# Patient Record
Sex: Female | Born: 1980 | Race: Black or African American | Hispanic: No | Marital: Single | Smoking: Former smoker
Health system: Southern US, Community
[De-identification: ages and names within clinical notes are randomized; demographics above are authoritative.]

## PROBLEM LIST (undated history)

## (undated) DIAGNOSIS — A159 Respiratory tuberculosis unspecified: Secondary | ICD-10-CM

## (undated) DIAGNOSIS — F419 Anxiety disorder, unspecified: Secondary | ICD-10-CM

## (undated) DIAGNOSIS — K219 Gastro-esophageal reflux disease without esophagitis: Secondary | ICD-10-CM

## (undated) DIAGNOSIS — D369 Benign neoplasm, unspecified site: Secondary | ICD-10-CM

## (undated) DIAGNOSIS — R7303 Prediabetes: Secondary | ICD-10-CM

## (undated) DIAGNOSIS — D219 Benign neoplasm of connective and other soft tissue, unspecified: Secondary | ICD-10-CM

## (undated) DIAGNOSIS — R87619 Unspecified abnormal cytological findings in specimens from cervix uteri: Secondary | ICD-10-CM

## (undated) DIAGNOSIS — G43829 Menstrual migraine, not intractable, without status migrainosus: Secondary | ICD-10-CM

## (undated) DIAGNOSIS — E785 Hyperlipidemia, unspecified: Secondary | ICD-10-CM

## (undated) DIAGNOSIS — A64 Unspecified sexually transmitted disease: Secondary | ICD-10-CM

## (undated) DIAGNOSIS — J189 Pneumonia, unspecified organism: Secondary | ICD-10-CM

## (undated) HISTORY — DX: Respiratory tuberculosis unspecified: A15.9

## (undated) HISTORY — PX: INDUCED ABORTION: SHX677

## (undated) HISTORY — DX: Unspecified sexually transmitted disease: A64

## (undated) HISTORY — PX: COLPOSCOPY: SHX161

## (undated) HISTORY — DX: Benign neoplasm of connective and other soft tissue, unspecified: D21.9

## (undated) HISTORY — DX: Anxiety disorder, unspecified: F41.9

## (undated) HISTORY — DX: Gastro-esophageal reflux disease without esophagitis: K21.9

## (undated) HISTORY — DX: Menstrual migraine, not intractable, without status migrainosus: G43.829

## (undated) HISTORY — DX: Pneumonia, unspecified organism: J18.9

## (undated) HISTORY — DX: Hyperlipidemia, unspecified: E78.5

## (undated) HISTORY — DX: Unspecified abnormal cytological findings in specimens from cervix uteri: R87.619

## (undated) HISTORY — DX: Prediabetes: R73.03

## (undated) HISTORY — PX: WISDOM TOOTH EXTRACTION: SHX21

## (undated) HISTORY — DX: Benign neoplasm, unspecified site: D36.9

---

## 2005-08-31 ENCOUNTER — Emergency Department (HOSPITAL_COMMUNITY): Admission: EM | Admit: 2005-08-31 | Discharge: 2005-08-31 | Payer: Self-pay | Admitting: Emergency Medicine

## 2006-04-17 ENCOUNTER — Emergency Department (HOSPITAL_COMMUNITY): Admission: EM | Admit: 2006-04-17 | Discharge: 2006-04-17 | Payer: Self-pay | Admitting: Emergency Medicine

## 2007-11-22 ENCOUNTER — Emergency Department (HOSPITAL_COMMUNITY): Admission: EM | Admit: 2007-11-22 | Discharge: 2007-11-23 | Payer: Self-pay | Admitting: Emergency Medicine

## 2008-01-23 ENCOUNTER — Inpatient Hospital Stay (HOSPITAL_COMMUNITY): Admission: AD | Admit: 2008-01-23 | Discharge: 2008-01-23 | Payer: Self-pay | Admitting: Obstetrics & Gynecology

## 2008-01-23 ENCOUNTER — Ambulatory Visit: Payer: Self-pay | Admitting: Obstetrics and Gynecology

## 2008-03-01 ENCOUNTER — Ambulatory Visit (HOSPITAL_COMMUNITY): Admission: RE | Admit: 2008-03-01 | Discharge: 2008-03-01 | Payer: Self-pay | Admitting: Family Medicine

## 2009-05-13 ENCOUNTER — Emergency Department (HOSPITAL_COMMUNITY): Admission: EM | Admit: 2009-05-13 | Discharge: 2009-05-14 | Payer: Self-pay | Admitting: Emergency Medicine

## 2009-11-13 ENCOUNTER — Emergency Department (HOSPITAL_COMMUNITY): Admission: EM | Admit: 2009-11-13 | Discharge: 2009-11-13 | Payer: Self-pay | Admitting: Emergency Medicine

## 2010-09-03 ENCOUNTER — Emergency Department (HOSPITAL_COMMUNITY)
Admission: EM | Admit: 2010-09-03 | Discharge: 2010-09-03 | Payer: Self-pay | Source: Home / Self Care | Admitting: Emergency Medicine

## 2010-10-23 ENCOUNTER — Emergency Department (HOSPITAL_COMMUNITY)
Admission: EM | Admit: 2010-10-23 | Discharge: 2010-10-23 | Payer: Self-pay | Source: Home / Self Care | Admitting: Emergency Medicine

## 2010-10-23 LAB — POCT PREGNANCY, URINE: Preg Test, Ur: NEGATIVE

## 2010-12-17 LAB — URINALYSIS, ROUTINE W REFLEX MICROSCOPIC
Bilirubin Urine: NEGATIVE
Glucose, UA: NEGATIVE mg/dL
Hgb urine dipstick: NEGATIVE
Ketones, ur: NEGATIVE mg/dL
Nitrite: NEGATIVE
Protein, ur: NEGATIVE mg/dL
Specific Gravity, Urine: 1.014 (ref 1.005–1.030)
Urobilinogen, UA: 0.2 mg/dL (ref 0.0–1.0)
pH: 7 (ref 5.0–8.0)

## 2010-12-17 LAB — POCT PREGNANCY, URINE: Preg Test, Ur: NEGATIVE

## 2011-05-26 ENCOUNTER — Emergency Department (HOSPITAL_COMMUNITY)
Admission: EM | Admit: 2011-05-26 | Discharge: 2011-05-26 | Disposition: A | Discharge status: Home / Self Care | Payer: Self-pay | Attending: Emergency Medicine | Admitting: Emergency Medicine

## 2011-05-26 DIAGNOSIS — N949 Unspecified condition associated with female genital organs and menstrual cycle: Secondary | ICD-10-CM | POA: Insufficient documentation

## 2011-05-26 DIAGNOSIS — N739 Female pelvic inflammatory disease, unspecified: Secondary | ICD-10-CM | POA: Insufficient documentation

## 2011-05-26 LAB — URINALYSIS, ROUTINE W REFLEX MICROSCOPIC
Bilirubin Urine: NEGATIVE
Glucose, UA: NEGATIVE mg/dL
Hgb urine dipstick: NEGATIVE
Ketones, ur: NEGATIVE mg/dL
Nitrite: NEGATIVE
Protein, ur: NEGATIVE mg/dL
Specific Gravity, Urine: 1 — ABNORMAL LOW (ref 1.005–1.030)
Urobilinogen, UA: 0.2 mg/dL (ref 0.0–1.0)
pH: 7 (ref 5.0–8.0)

## 2011-05-26 LAB — URINE MICROSCOPIC-ADD ON

## 2014-05-11 ENCOUNTER — Encounter (HOSPITAL_COMMUNITY): Payer: Self-pay | Admitting: Emergency Medicine

## 2014-05-11 DIAGNOSIS — Z3202 Encounter for pregnancy test, result negative: Secondary | ICD-10-CM | POA: Insufficient documentation

## 2014-05-11 DIAGNOSIS — J020 Streptococcal pharyngitis: Secondary | ICD-10-CM | POA: Insufficient documentation

## 2014-05-11 DIAGNOSIS — R51 Headache: Secondary | ICD-10-CM | POA: Insufficient documentation

## 2014-05-11 DIAGNOSIS — F17200 Nicotine dependence, unspecified, uncomplicated: Secondary | ICD-10-CM | POA: Insufficient documentation

## 2014-05-11 LAB — URINE MICROSCOPIC-ADD ON

## 2014-05-11 LAB — URINALYSIS, ROUTINE W REFLEX MICROSCOPIC
BILIRUBIN URINE: NEGATIVE
GLUCOSE, UA: NEGATIVE mg/dL
Ketones, ur: 15 mg/dL — AB
Nitrite: NEGATIVE
Protein, ur: NEGATIVE mg/dL
Specific Gravity, Urine: 1.021 (ref 1.005–1.030)
Urobilinogen, UA: 1 mg/dL (ref 0.0–1.0)
pH: 6 (ref 5.0–8.0)

## 2014-05-11 LAB — COMPREHENSIVE METABOLIC PANEL
ALT: 16 U/L (ref 0–35)
AST: 23 U/L (ref 0–37)
Albumin: 4.4 g/dL (ref 3.5–5.2)
Alkaline Phosphatase: 53 U/L (ref 39–117)
Anion gap: 15 (ref 5–15)
BUN: 9 mg/dL (ref 6–23)
CALCIUM: 9.6 mg/dL (ref 8.4–10.5)
CHLORIDE: 95 meq/L — AB (ref 96–112)
CO2: 25 mEq/L (ref 19–32)
CREATININE: 1.15 mg/dL — AB (ref 0.50–1.10)
GFR calc Af Amer: 73 mL/min — ABNORMAL LOW (ref >90)
GFR, EST NON AFRICAN AMERICAN: 63 mL/min — AB (ref >90)
Glucose, Bld: 113 mg/dL — ABNORMAL HIGH (ref 70–99)
Potassium: 3.8 mEq/L (ref 3.7–5.3)
Sodium: 135 mEq/L — ABNORMAL LOW (ref 137–147)
Total Bilirubin: 0.5 mg/dL (ref 0.3–1.2)
Total Protein: 8.4 g/dL — ABNORMAL HIGH (ref 6.0–8.3)

## 2014-05-11 LAB — CBC WITH DIFFERENTIAL/PLATELET
Basophils Absolute: 0 10*3/uL (ref 0.0–0.1)
Basophils Relative: 0 % (ref 0–1)
Eosinophils Absolute: 0 10*3/uL (ref 0.0–0.7)
Eosinophils Relative: 0 % (ref 0–5)
HCT: 42.7 % (ref 36.0–46.0)
Hemoglobin: 14.2 g/dL (ref 12.0–15.0)
Lymphocytes Relative: 11 % — ABNORMAL LOW (ref 12–46)
Lymphs Abs: 1.1 10*3/uL (ref 0.7–4.0)
MCH: 29.6 pg (ref 26.0–34.0)
MCHC: 33.3 g/dL (ref 30.0–36.0)
MCV: 89 fL (ref 78.0–100.0)
Monocytes Absolute: 0.7 10*3/uL (ref 0.1–1.0)
Monocytes Relative: 7 % (ref 3–12)
Neutro Abs: 8.6 10*3/uL — ABNORMAL HIGH (ref 1.7–7.7)
Neutrophils Relative %: 82 % — ABNORMAL HIGH (ref 43–77)
Platelets: 224 10*3/uL (ref 150–400)
RBC: 4.8 MIL/uL (ref 3.87–5.11)
RDW: 12.5 % (ref 11.5–15.5)
WBC: 10.5 10*3/uL (ref 4.0–10.5)

## 2014-05-11 LAB — PREGNANCY, URINE: PREG TEST UR: NEGATIVE

## 2014-05-11 NOTE — ED Notes (Signed)
She took tylenol 2 hours ago

## 2014-05-11 NOTE — ED Notes (Signed)
The pt  Has had a headache since yesterday n v lmp  July 5th

## 2014-05-12 ENCOUNTER — Emergency Department (HOSPITAL_COMMUNITY)
Admission: EM | Admit: 2014-05-12 | Discharge: 2014-05-12 | Discharge status: Against Medical Advice | Payer: Medicaid Other | Attending: Emergency Medicine | Admitting: Emergency Medicine

## 2014-05-12 DIAGNOSIS — J020 Streptococcal pharyngitis: Secondary | ICD-10-CM

## 2014-05-12 LAB — RAPID STREP SCREEN (MED CTR MEBANE ONLY): STREPTOCOCCUS, GROUP A SCREEN (DIRECT): POSITIVE — AB

## 2014-05-12 MED ORDER — IBUPROFEN 800 MG PO TABS
800.0000 mg | ORAL_TABLET | Freq: Once | ORAL | Status: AC
  Administered 2014-05-12: 800 mg via ORAL

## 2014-05-12 MED ORDER — HYDROCODONE-ACETAMINOPHEN 7.5-325 MG/15ML PO SOLN
10.0000 mL | Freq: Once | ORAL | Status: AC
  Administered 2014-05-12: 10 mL via ORAL

## 2014-05-12 MED ORDER — PENICILLIN G BENZATHINE 1200000 UNIT/2ML IM SUSP
1.2000 10*6.[IU] | Freq: Once | INTRAMUSCULAR | Status: AC
  Administered 2014-05-12: 1.2 10*6.[IU] via INTRAMUSCULAR
  Filled 2014-05-12: qty 2

## 2014-05-12 MED ORDER — HYDROCODONE-ACETAMINOPHEN 7.5-325 MG/15ML PO SOLN
ORAL | Status: AC
  Filled 2014-05-12: qty 15

## 2014-05-12 MED ORDER — HYDROCODONE-ACETAMINOPHEN 7.5-325 MG/15ML PO SOLN: 10.0000 mL | Freq: Four times a day (QID) | ORAL | Status: DC | PRN

## 2014-05-12 NOTE — Discharge Instructions (Signed)
You were given a penicillin shot today, which should cure your strep throat.  Alternate tylenol and motrin every 4-6 hours for fever, pain.  Hycet elixir contains tylenol!  Return to the ER for worsening condition or new concerning symptoms.   Salt Water Gargle This solution will help make your mouth and throat feel better. HOME CARE INSTRUCTIONS   Mix 1 teaspoon of salt in 8 ounces of warm water.  Gargle with this solution as much or often as you need or as directed. Swish and gargle gently if you have any sores or wounds in your mouth.  Do not swallow this mixture. Document Released: 06/18/2004 Document Revised: 12/07/2011 Document Reviewed: 11/09/2008 [facility] Patient Information 2015 [place], [place]. This information is not intended to replace advice given to you by your health care provider. Make sure you discuss any questions you have with your health care provider.  Strep Throat Strep throat is an infection of the throat caused by a bacteria named Streptococcus pyogenes. Your health care provider may call the infection streptococcal "tonsillitis" or "pharyngitis" depending on whether there are signs of inflammation in the tonsils or back of the throat. Strep throat is most common in children aged 5-15 years during the cold months of the year, but it can occur in people of any age during any season. This infection is spread from person to person (contagious) through coughing, sneezing, or other close contact. SIGNS AND SYMPTOMS   Fever or chills.  Painful, swollen, red tonsils or throat.  Pain or difficulty when swallowing.  White or yellow spots on the tonsils or throat.  Swollen, tender lymph nodes or "glands" of the neck or under the jaw.  Red rash all over the body (rare). DIAGNOSIS  Many different infections can cause the same symptoms. A test must be done to confirm the diagnosis so the right treatment can be given. A "rapid strep test" can help your health care provider  make the diagnosis in a few minutes. If this test is not available, a light swab of the infected area can be used for a throat culture test. If a throat culture test is done, results are usually available in a day or two. TREATMENT  Strep throat is treated with antibiotic medicine. HOME CARE INSTRUCTIONS   Gargle with 1 tsp of salt in 1 cup of warm water, 3-4 times per day or as needed for comfort.  Family members who also have a sore throat or fever should be tested for strep throat and treated with antibiotics if they have the strep infection.  Make sure everyone in your household washes their hands well.  Do not share food, drinking cups, or personal items that could cause the infection to spread to others.  You may need to eat a soft food diet until your sore throat gets better.  Drink enough water and fluids to keep your urine clear or pale yellow. This will help prevent dehydration.  Get plenty of rest.  Stay home from school, day care, or work until you have been on antibiotics for 24 hours.  Take medicines only as directed by your health care provider.  Take your antibiotic medicine as directed by your health care provider. Finish it even if you start to feel better. SEEK MEDICAL CARE IF:   The glands in your neck continue to enlarge.  You develop a rash, cough, or earache.  You cough up green, yellow-brown, or bloody sputum.  You have pain or discomfort not controlled by medicines.  Your problems seem to be getting worse rather than better.  You have a fever. SEEK IMMEDIATE MEDICAL CARE IF:   You develop any new symptoms such as vomiting, severe headache, stiff or painful neck, chest pain, shortness of breath, or trouble swallowing.  You develop severe throat pain, drooling, or changes in your voice.  You develop swelling of the neck, or the skin on the neck becomes red and tender.  You develop signs of dehydration, such as fatigue, dry mouth, and decreased  urination.  You become increasingly sleepy, or you cannot wake up completely. MAKE SURE YOU:  Understand these instructions.  Will watch your condition.  Will get help right away if you are not doing well or get worse. Document Released: 09/11/2000 Document Revised: 01/29/2014 Document Reviewed: 11/13/2010 [facility] Patient Information 2015 [place], [place]. This information is not intended to replace advice given to you by your health care provider. Make sure you discuss any questions you have with your health care provider.

## 2014-05-12 NOTE — ED Provider Notes (Signed)
CSN: [id number]     Arrival date & time 05/11/14  2216 History   First MD Initiated Contact with Patient 05/12/14 [phone]     Chief Complaint  Patient presents with  . Headache     (Consider location/radiation/quality/duration/timing/severity/associated sxs/prior Treatment) HPI 33 year old female presents to emergency room with complaint of fever and sore throat.  Symptoms started this morning.  She denies any recent URI symptoms, no cough nose cough congestion.  No sick contacts.  Patient has Tylenol with some improvement in pain. History reviewed. No pertinent past medical history. History reviewed. No pertinent past surgical history. No family history on file. History  Substance Use Topics  . Smoking status: Current Every Day Smoker  . Smokeless tobacco: Not on file  . Alcohol Use: Yes   OB History   Grav Para Term Preterm Abortions TAB SAB Ect Mult Living                 Review of Systems   See History of Present Illness; otherwise all other systems are reviewed and negative  Allergies  Review of patient's allergies indicates no known allergies.  Home Medications   Prior to Admission medications   Medication Sig Start Date End Date Taking? Authorizing Provider  acetaminophen (TYLENOL) 500 MG tablet Take 1,000 mg by mouth every 6 (six) hours as needed for headache.   Yes Historical Provider, MD  HYDROcodone-acetaminophen (HYCET) 7.5-325 mg/15 ml solution Take 10 mLs by mouth every 6 (six) hours as needed for moderate pain. 05/12/14   Kalman Drape, MD   BP 97/40  Pulse 108  Temp(Src) 100.5 F (38.1 C) (Oral)  Resp 18  Ht 5' 4.5" (1.638 m)  Wt 191 lb (86.637 kg)  BMI 32.29 kg/m2  SpO2 98%  LMP 04/01/2014 Physical Exam  Nursing note and vitals reviewed. Constitutional: She is oriented to person, place, and time. She appears well-developed and well-nourished.  HENT:  Head: Normocephalic and atraumatic.  Right Ear: External ear normal.  Left Ear: External ear normal.   Nose: Nose normal.  Mouth/Throat: Oropharyngeal exudate (patient has right-sided tonsillar enlargement with exudate.  There is no uvular displacement) present.  Eyes: Conjunctivae and EOM are normal. Pupils are equal, round, and reactive to light.  Neck: Normal range of motion. Neck supple. No JVD present. No tracheal deviation present. No thyromegaly present.  Cardiovascular: Normal rate, regular rhythm, normal heart sounds and intact distal pulses.  Exam reveals no gallop and no friction rub.   No murmur heard. Pulmonary/Chest: Effort normal and breath sounds normal. No stridor. No respiratory distress. She has no wheezes. She has no rales. She exhibits no tenderness.  Abdominal: Soft. Bowel sounds are normal. She exhibits no distension and no mass. There is no tenderness. There is no rebound and no guarding.  Musculoskeletal: Normal range of motion. She exhibits no edema and no tenderness.  Lymphadenopathy:    She has no cervical adenopathy.  Neurological: She is alert and oriented to person, place, and time. She exhibits normal muscle tone. Coordination normal.  Skin: Skin is warm and dry. No rash noted. No erythema. No pallor.  Psychiatric: She has a normal mood and affect. Her behavior is normal. Judgment and thought content normal.    ED Course  Procedures (including critical care time) Labs Review Labs Reviewed  RAPID STREP SCREEN - Abnormal; Notable for the following:    Streptococcus, Group A Screen (Direct) POSITIVE (*)    All other components within normal limits  URINALYSIS, ROUTINE  W REFLEX MICROSCOPIC - Abnormal; Notable for the following:    Hgb urine dipstick MODERATE (*)    Ketones, ur 15 (*)    Leukocytes, UA SMALL (*)    All other components within normal limits  CBC WITH DIFFERENTIAL - Abnormal; Notable for the following:    Neutrophils Relative % 82 (*)    Neutro Abs 8.6 (*)    Lymphocytes Relative 11 (*)    All other components within normal limits   COMPREHENSIVE METABOLIC PANEL - Abnormal; Notable for the following:    Sodium 135 (*)    Chloride 95 (*)    Glucose, Bld 113 (*)    Creatinine, Ser 1.15 (*)    Total Protein 8.4 (*)    GFR calc non Af Amer 63 (*)    GFR calc Af Amer 73 (*)    All other components within normal limits  URINE MICROSCOPIC-ADD ON - Abnormal; Notable for the following:    Bacteria, UA FEW (*)    All other components within normal limits  PREGNANCY, URINE    Imaging Review No results found.   EKG Interpretation None      MDM   Final diagnoses:  Strep pharyngitis    33 year old female with strep pharyngitis.  Patient has received penicillin IM.  No signs of peritonsillar abscess, no difficulties handling her own secretions or breathing.    Kalman Drape, MD 05/12/14 [phone]

## 2014-05-12 NOTE — ED Notes (Signed)
Patient left without getting discharge instructions.

## 2014-06-04 ENCOUNTER — Encounter (HOSPITAL_COMMUNITY): Payer: Self-pay | Admitting: *Deleted

## 2014-06-04 ENCOUNTER — Inpatient Hospital Stay (HOSPITAL_COMMUNITY): Payer: Medicaid Other

## 2014-06-04 ENCOUNTER — Inpatient Hospital Stay (HOSPITAL_COMMUNITY)
Admission: AD | Admit: 2014-06-04 | Discharge: 2014-06-04 | Disposition: A | Discharge status: Home / Self Care | Payer: Medicaid Other | Source: Ambulatory Visit | Attending: Family Medicine | Admitting: Family Medicine

## 2014-06-04 DIAGNOSIS — M545 Low back pain, unspecified: Secondary | ICD-10-CM | POA: Insufficient documentation

## 2014-06-04 DIAGNOSIS — D279 Benign neoplasm of unspecified ovary: Secondary | ICD-10-CM | POA: Diagnosis not present

## 2014-06-04 DIAGNOSIS — N949 Unspecified condition associated with female genital organs and menstrual cycle: Secondary | ICD-10-CM | POA: Diagnosis not present

## 2014-06-04 DIAGNOSIS — K5900 Constipation, unspecified: Secondary | ICD-10-CM | POA: Insufficient documentation

## 2014-06-04 DIAGNOSIS — D270 Benign neoplasm of right ovary: Secondary | ICD-10-CM

## 2014-06-04 DIAGNOSIS — R109 Unspecified abdominal pain: Secondary | ICD-10-CM | POA: Diagnosis present

## 2014-06-04 HISTORY — DX: Respiratory tuberculosis unspecified: A15.9

## 2014-06-04 LAB — POCT PREGNANCY, URINE: Preg Test, Ur: NEGATIVE

## 2014-06-04 LAB — URINALYSIS, ROUTINE W REFLEX MICROSCOPIC
Bilirubin Urine: NEGATIVE
Glucose, UA: NEGATIVE mg/dL
KETONES UR: NEGATIVE mg/dL
LEUKOCYTES UA: NEGATIVE
Nitrite: NEGATIVE
Protein, ur: NEGATIVE mg/dL
Specific Gravity, Urine: 1.015 (ref 1.005–1.030)
Urobilinogen, UA: 0.2 mg/dL (ref 0.0–1.0)
pH: 6.5 (ref 5.0–8.0)

## 2014-06-04 LAB — CBC
HCT: 41 % (ref 36.0–46.0)
Hemoglobin: 14 g/dL (ref 12.0–15.0)
MCH: 29.6 pg (ref 26.0–34.0)
MCHC: 34.1 g/dL (ref 30.0–36.0)
MCV: 86.7 fL (ref 78.0–100.0)
PLATELETS: 282 10*3/uL (ref 150–400)
RBC: 4.73 MIL/uL (ref 3.87–5.11)
RDW: 12.4 % (ref 11.5–15.5)
WBC: 8.4 10*3/uL (ref 4.0–10.5)

## 2014-06-04 LAB — HCG, QUANTITATIVE, PREGNANCY

## 2014-06-04 LAB — URINE MICROSCOPIC-ADD ON

## 2014-06-04 LAB — WET PREP, GENITAL
Clue Cells Wet Prep HPF POC: NONE SEEN
TRICH WET PREP: NONE SEEN
Yeast Wet Prep HPF POC: NONE SEEN

## 2014-06-04 LAB — HIV ANTIBODY (ROUTINE TESTING W REFLEX): HIV 1&2 Ab, 4th Generation: NONREACTIVE

## 2014-06-04 MED ORDER — IBUPROFEN 600 MG PO TABS: 600.0000 mg | ORAL_TABLET | Freq: Four times a day (QID) | ORAL | Status: DC | PRN

## 2014-06-04 NOTE — Discharge Instructions (Signed)
Ovarian Cyst An ovarian cyst is a fluid-filled sac that forms on an ovary. The ovaries are small organs that produce eggs in women. Various types of cysts can form on the ovaries. Most are not cancerous. Many do not cause problems, and they often go away on their own. Some may cause symptoms and require treatment. Common types of ovarian cysts include:  Functional cysts--These cysts may occur every month during the menstrual cycle. This is normal. The cysts usually go away with the next menstrual cycle if the woman does not get pregnant. Usually, there are no symptoms with a functional cyst.  Endometrioma cysts--These cysts form from the tissue that lines the uterus. They are also called "chocolate cysts" because they become filled with blood that turns brown. This type of cyst can cause pain in the lower abdomen during intercourse and with your menstrual period.  Cystadenoma cysts--This type develops from the cells on the outside of the ovary. These cysts can get very big and cause lower abdomen pain and pain with intercourse. This type of cyst can twist on itself, cut off its blood supply, and cause severe pain. It can also easily rupture and cause a lot of pain.  Dermoid cysts--This type of cyst is sometimes found in both ovaries. These cysts may contain different kinds of body tissue, such as skin, teeth, hair, or cartilage. They usually do not cause symptoms unless they get very big.  Theca lutein cysts--These cysts occur when too much of a certain hormone (human chorionic gonadotropin) is produced and overstimulates the ovaries to produce an egg. This is most common after procedures used to assist with the conception of a baby (in vitro fertilization). CAUSES   Fertility drugs can cause a condition in which multiple large cysts are formed on the ovaries. This is called ovarian hyperstimulation syndrome.  A condition called polycystic ovary syndrome can cause hormonal imbalances that can lead to  nonfunctional ovarian cysts. SIGNS AND SYMPTOMS  Many ovarian cysts do not cause symptoms. If symptoms are present, they may include:  Pelvic pain or pressure.  Pain in the lower abdomen.  Pain during sexual intercourse.  Increasing girth (swelling) of the abdomen.  Abnormal menstrual periods.  Increasing pain with menstrual periods.  Stopping having menstrual periods without being pregnant. DIAGNOSIS  These cysts are commonly found during a routine or annual pelvic exam. Tests may be ordered to find out more about the cyst. These tests may include:  Ultrasound.  X-ray of the pelvis.  CT scan.  MRI.  Blood tests. TREATMENT  Many ovarian cysts go away on their own without treatment. Your health care provider may want to check your cyst regularly for 2-3 months to see if it changes. For women in menopause, it is particularly important to monitor a cyst closely because of the higher rate of ovarian cancer in menopausal women. When treatment is needed, it may include any of the following:  A procedure to drain the cyst (aspiration). This may be done using a long needle and ultrasound. It can also be done through a laparoscopic procedure. This involves using a thin, lighted tube with a tiny camera on the end (laparoscope) inserted through a small incision.  Surgery to remove the whole cyst. This may be done using laparoscopic surgery or an open surgery involving a larger incision in the lower abdomen.  Hormone treatment or birth control pills. These methods are sometimes used to help dissolve a cyst. HOME CARE INSTRUCTIONS   Only take over-the-counter   or prescription medicines as directed by your health care provider.  Follow up with your health care provider as directed.  Get regular pelvic exams and Pap tests. SEEK MEDICAL CARE IF:   Your periods are late, irregular, or painful, or they stop.  Your pelvic pain or abdominal pain does not go away.  Your abdomen becomes  larger or swollen.  You have pressure on your bladder or trouble emptying your bladder completely.  You have pain during sexual intercourse.  You have feelings of fullness, pressure, or discomfort in your stomach.  You lose weight for no apparent reason.  You feel generally ill.  You become constipated.  You lose your appetite.  You develop acne.  You have an increase in body and facial hair.  You are gaining weight, without changing your exercise and eating habits.  You think you are pregnant. SEEK IMMEDIATE MEDICAL CARE IF:   You have increasing abdominal pain.  You feel sick to your stomach (nauseous), and you throw up (vomit).  You develop a fever that comes on suddenly.  You have abdominal pain during a bowel movement.  Your menstrual periods become heavier than usual. MAKE SURE YOU:  Understand these instructions.  Will watch your condition.  Will get help right away if you are not doing well or get worse. Document Released: 09/14/2005 Document Revised: 09/19/2013 Document Reviewed: 05/22/2013 [employer] Patient Information 2015 [employer]. This information is not intended to replace advice given to you by your health care provider. Make sure you discuss any questions you have with your health care provider.  

## 2014-06-04 NOTE — MAU Provider Note (Signed)
History     CSN: [id number]  Arrival date and time: 06/04/14 7846   First Provider Initiated Contact with Patient 06/04/14 [phone]      Chief Complaint  Patient presents with  . Abdominal Pain  . Back Pain   HPI This is a 33 y.o. female who presents for pregnancy test.  Has had constipation with low back pain and pelvic pressure.  Had BM today, but states this only happens when she is pregnant. Negative test at home and here. Wants blood test, since her sister had a "4 month pregnancy in her tubes with a negative test" and that she always shows negative until "4 months".  Missed period in August. Had a light one in July.   Rn Note:  C/o 2 missed periods; hx of irregular periods; LMNP was July;       OB History   Grav Para Term Preterm Abortions TAB SAB Ect Mult Living   3 2 2  1 1    2       Past Medical History  Diagnosis Date  . TB (tuberculosis)     Past Surgical History  Procedure Laterality Date  . Induced abortion      Family History  Problem Relation Age of Onset  . Stroke Mother   . Hypertension Mother   . Diabetes Mother     History  Substance Use Topics  . Smoking status: Current Every Day Smoker -- 0.50 packs/day  . Smokeless tobacco: Not on file  . Alcohol Use: Yes    Allergies: No Known Allergies  Prescriptions prior to admission  Medication Sig Dispense Refill  . acetaminophen (TYLENOL) 500 MG tablet Take 1,000 mg by mouth every 6 (six) hours as needed for headache.      Marland Kitchen HYDROcodone-acetaminophen (HYCET) 7.5-325 mg/15 ml solution Take 10 mLs by mouth every 6 (six) hours as needed for moderate pain.  120 mL  0    Review of Systems  Constitutional: Negative for fever, chills and malaise/fatigue.  Gastrointestinal: Positive for abdominal pain and constipation. Negative for nausea, vomiting and diarrhea.  Genitourinary: Negative for dysuria.   Physical Exam   Blood pressure 121/81, pulse 102, temperature 98.9 F (37.2 C), temperature source  Oral, resp. rate 16, height 5\' 3"  (1.6 m), weight 84.823 kg (187 lb), last menstrual period 04/01/2014.  Physical Exam  Constitutional: She is oriented to person, place, and time. She appears well-developed and well-nourished. No distress.  HENT:  Head: Normocephalic.  Cardiovascular: Normal rate.   Respiratory: Effort normal.  GI: Soft. She exhibits no distension. There is tenderness (Pelvic tenderness over uterus). There is no rebound and no guarding.  Genitourinary:  Cervix closed Uterus small, slightly tender Adnexa tender on Right, nontender Left  Musculoskeletal: Normal range of motion.  Neurological: She is alert and oriented to person, place, and time.  Skin: Skin is warm and dry.  Psychiatric: She has a normal mood and affect.    MAU Course  Procedures  MDM Results for orders placed during the hospital encounter of 06/04/14 (from the past 24 hour(s))  URINALYSIS, ROUTINE W REFLEX MICROSCOPIC     Status: Abnormal   Collection Time    06/04/14  9:18 AM      Result Value Ref Range   Color, Urine YELLOW  YELLOW   APPearance CLEAR  CLEAR   Specific Gravity, Urine 1.015  1.005 - 1.030   pH 6.5  5.0 - 8.0   Glucose, UA NEGATIVE  NEGATIVE mg/dL  Hgb urine dipstick TRACE (*) NEGATIVE   Bilirubin Urine NEGATIVE  NEGATIVE   Ketones, ur NEGATIVE  NEGATIVE mg/dL   Protein, ur NEGATIVE  NEGATIVE mg/dL   Urobilinogen, UA 0.2  0.0 - 1.0 mg/dL   Nitrite NEGATIVE  NEGATIVE   Leukocytes, UA NEGATIVE  NEGATIVE  URINE MICROSCOPIC-ADD ON     Status: Abnormal   Collection Time    06/04/14  9:18 AM      Result Value Ref Range   Squamous Epithelial / LPF FEW (*) RARE   WBC, UA 0-2  <3 WBC/hpf   RBC / HPF 0-2  <3 RBC/hpf   Bacteria, UA FEW (*) RARE   Urine-Other MUCOUS PRESENT    POCT PREGNANCY, URINE     Status: None   Collection Time    06/04/14  9:24 AM      Result Value Ref Range   Preg Test, Ur NEGATIVE  NEGATIVE  WET PREP, GENITAL     Status: Abnormal   Collection Time     06/04/14  9:50 AM      Result Value Ref Range   Yeast Wet Prep HPF POC NONE SEEN  NONE SEEN   Trich, Wet Prep NONE SEEN  NONE SEEN   Clue Cells Wet Prep HPF POC NONE SEEN  NONE SEEN   WBC, Wet Prep HPF POC FEW (*) NONE SEEN  HCG, QUANTITATIVE, PREGNANCY     Status: None   Collection Time    06/04/14 10:00 AM      Result Value Ref Range   hCG, Beta Chain, Quant, S <1  <5 mIU/mL  CBC     Status: None   Collection Time    06/04/14 10:00 AM      Result Value Ref Range   WBC 8.4  4.0 - 10.5 K/uL   RBC 4.73  3.87 - 5.11 MIL/uL   Hemoglobin 14.0  12.0 - 15.0 g/dL   HCT 41.0  36.0 - 46.0 %   MCV 86.7  78.0 - 100.0 fL   MCH 29.6  26.0 - 34.0 pg   MCHC 34.1  30.0 - 36.0 g/dL   RDW 12.4  11.5 - 15.5 %   Platelets 282  150 - 400 K/uL   US Pelvis Complete  06/04/2014   CLINICAL DATA:  Abdominal and back pain for 12 hr.  EXAM: TRANSABDOMINAL AND TRANSVAGINAL ULTRASOUND OF PELVIS  TECHNIQUE: Both transabdominal and transvaginal ultrasound examinations of the pelvis were performed. Transabdominal technique was performed for global imaging of the pelvis including uterus, ovaries, adnexal regions, and pelvic cul-de-sac. It was necessary to proceed with endovaginal exam following the transabdominal exam to visualize the ovaries. S  COMPARISON:  Ob sonogram 03/01/2008    FINDINGS: Uterus  Measurements: 8.5 x 3.8 x 5.2 cm. No fibroids or other mass visualized.                     Endometrium  Thickness: 5.3 mm.  No focal abnormality visualized.                      Right ovary  Measurements: 3.7 x 2.5 x 3.4 cm. 2.5 x 2.2 x 2.1 cm and 1 x 0.9 x 0.9 cm echogenic structure within the right ovary.                       Left ovary  Measurements: 4.3 x 2.6 x 2.8 cm. Normal appearance/no adnexal  mass.                      Other findings  Trace amount of fluid in the cul-de-sac.    IMPRESSION: Two echogenic structures within the right ovary as detailed above measuring up to 2.5 cm maximal dimension may  represent dermoids.                             To help confirm this and exclude other causes of these echogenic structures elected pelvic MR may be considered.                              On limited color Doppler examination, flow is seen to the ovaries.     Electronically Signed   By: Chauncey Cruel M.D.   On: 06/04/2014 11:40    Assessment and Plan  A:  Pelvic fullness/pain      Right Dermoid cysts x 2      Not pregnant       Constipation  P:  Discussed findings       Recommend Miralax prn       Discussed with Dr Kennon Rounds, needs to see surgeon for removal of dermoids       Message sent to clinic for appointment        Rx ibuprofen for pain  [facility] 06/04/2014, 10:12 AM

## 2014-06-04 NOTE — MAU Note (Signed)
C/o 2 missed periods; hx of irregular periods; LMNP was July;

## 2014-06-04 NOTE — MAU Note (Signed)
C/o abdominal pain and back pain with rectal aching since last night around 2300;

## 2014-06-04 NOTE — MAU Provider Note (Signed)
Attestation of Attending Supervision of Advanced Practitioner (PA/CNM/NP): Evaluation and management procedures were performed by the Advanced Practitioner under my supervision and collaboration.  I have reviewed the Advanced Practitioner's note and chart, and I agree with the management and plan.  Donnamae Jude, MD [facility] Attending 06/04/2014 12:53 PM

## 2014-06-05 ENCOUNTER — Encounter: Payer: Self-pay | Admitting: Obstetrics & Gynecology

## 2014-06-05 LAB — GC/CHLAMYDIA PROBE AMP
CT PROBE, AMP APTIMA: NEGATIVE
GC Probe RNA: NEGATIVE

## 2014-07-07 ENCOUNTER — Emergency Department (HOSPITAL_COMMUNITY): Payer: Medicaid Other

## 2014-07-07 ENCOUNTER — Emergency Department (HOSPITAL_COMMUNITY)
Admission: EM | Admit: 2014-07-07 | Discharge: 2014-07-07 | Disposition: A | Discharge status: Home / Self Care | Payer: Medicaid Other | Attending: Emergency Medicine | Admitting: Emergency Medicine

## 2014-07-07 ENCOUNTER — Encounter (HOSPITAL_COMMUNITY): Payer: Self-pay | Admitting: Emergency Medicine

## 2014-07-07 DIAGNOSIS — Y92410 Unspecified street and highway as the place of occurrence of the external cause: Secondary | ICD-10-CM | POA: Insufficient documentation

## 2014-07-07 DIAGNOSIS — Z3202 Encounter for pregnancy test, result negative: Secondary | ICD-10-CM | POA: Diagnosis not present

## 2014-07-07 DIAGNOSIS — Z720 Tobacco use: Secondary | ICD-10-CM | POA: Insufficient documentation

## 2014-07-07 DIAGNOSIS — S46811A Strain of other muscles, fascia and tendons at shoulder and upper arm level, right arm, initial encounter: Secondary | ICD-10-CM

## 2014-07-07 DIAGNOSIS — Y9389 Activity, other specified: Secondary | ICD-10-CM | POA: Diagnosis not present

## 2014-07-07 DIAGNOSIS — Z8611 Personal history of tuberculosis: Secondary | ICD-10-CM | POA: Insufficient documentation

## 2014-07-07 DIAGNOSIS — S43491A Other sprain of right shoulder joint, initial encounter: Secondary | ICD-10-CM | POA: Insufficient documentation

## 2014-07-07 DIAGNOSIS — S4991XA Unspecified injury of right shoulder and upper arm, initial encounter: Secondary | ICD-10-CM | POA: Diagnosis present

## 2014-07-07 DIAGNOSIS — Z79899 Other long term (current) drug therapy: Secondary | ICD-10-CM | POA: Insufficient documentation

## 2014-07-07 LAB — POC URINE PREG, ED: PREG TEST UR: NEGATIVE

## 2014-07-07 MED ORDER — LORAZEPAM 1 MG PO TABS
1.0000 mg | ORAL_TABLET | Freq: Once | ORAL | Status: AC
  Administered 2014-07-07: 1 mg via ORAL
  Filled 2014-07-07: qty 1

## 2014-07-07 MED ORDER — NAPROXEN 500 MG PO TABS: 500.0000 mg | ORAL_TABLET | Freq: Two times a day (BID) | ORAL | Status: DC

## 2014-07-07 MED ORDER — DIAZEPAM 5 MG PO TABS: 5.0000 mg | ORAL_TABLET | Freq: Two times a day (BID) | ORAL | Status: DC
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
## 2014-07-07 NOTE — Discharge Instructions (Signed)

## 2014-07-07 NOTE — ED Notes (Signed)
Patient transported to CT 

## 2014-07-07 NOTE — ED Provider Notes (Signed)
Medical screening examination/treatment/procedure(s) were performed by non-physician practitioner and as supervising physician I was immediately available for consultation/collaboration.   EKG Interpretation None       Laurence Crofford K Vasiliy Mccarry-Rasch, MD 07/07/14 [phone]

## 2014-07-18 ENCOUNTER — Encounter: Payer: Self-pay | Admitting: Obstetrics & Gynecology

## 2014-07-30 ENCOUNTER — Encounter (HOSPITAL_COMMUNITY): Payer: Self-pay | Admitting: Emergency Medicine

## 2014-08-13 ENCOUNTER — Encounter: Payer: Self-pay | Admitting: Obstetrics & Gynecology

## 2014-08-13 ENCOUNTER — Other Ambulatory Visit (HOSPITAL_COMMUNITY)
Admission: RE | Admit: 2014-08-13 | Discharge: 2014-08-13 | Disposition: A | Discharge status: Home / Self Care | Payer: Medicaid Other | Source: Ambulatory Visit | Attending: Obstetrics & Gynecology | Admitting: Obstetrics & Gynecology

## 2014-08-13 ENCOUNTER — Ambulatory Visit (INDEPENDENT_AMBULATORY_CARE_PROVIDER_SITE_OTHER): Payer: Medicaid Other | Admitting: Obstetrics & Gynecology

## 2014-08-13 VITALS — BP 96/45 | HR 76 | Temp 98.8°F

## 2014-08-13 DIAGNOSIS — N8320 Unspecified ovarian cysts: Secondary | ICD-10-CM

## 2014-08-13 DIAGNOSIS — Z01419 Encounter for gynecological examination (general) (routine) without abnormal findings: Secondary | ICD-10-CM | POA: Diagnosis present

## 2014-08-13 DIAGNOSIS — Z1151 Encounter for screening for human papillomavirus (HPV): Secondary | ICD-10-CM

## 2014-08-13 DIAGNOSIS — Z0000 Encounter for general adult medical examination without abnormal findings: Secondary | ICD-10-CM

## 2014-08-13 DIAGNOSIS — N83209 Unspecified ovarian cyst, unspecified side: Secondary | ICD-10-CM | POA: Insufficient documentation

## 2014-08-13 DIAGNOSIS — Z124 Encounter for screening for malignant neoplasm of cervix: Secondary | ICD-10-CM

## 2014-08-13 DIAGNOSIS — N83201 Unspecified ovarian cyst, right side: Secondary | ICD-10-CM

## 2014-08-13 NOTE — Patient Instructions (Signed)
Ovarian Cyst An ovarian cyst is a fluid-filled sac that forms on an ovary. The ovaries are small organs that produce eggs in women. Various types of cysts can form on the ovaries. Most are not cancerous. Many do not cause problems, and they often go away on their own. Some may cause symptoms and require treatment. Common types of ovarian cysts include:  Functional cysts--These cysts may occur every month during the menstrual cycle. This is normal. The cysts usually go away with the next menstrual cycle if the woman does not get pregnant. Usually, there are no symptoms with a functional cyst.  Endometrioma cysts--These cysts form from the tissue that lines the uterus. They are also called "chocolate cysts" because they become filled with blood that turns brown. This type of cyst can cause pain in the lower abdomen during intercourse and with your menstrual period.  Cystadenoma cysts--This type develops from the cells on the outside of the ovary. These cysts can get very big and cause lower abdomen pain and pain with intercourse. This type of cyst can twist on itself, cut off its blood supply, and cause severe pain. It can also easily rupture and cause a lot of pain.  Dermoid cysts--This type of cyst is sometimes found in both ovaries. These cysts may contain different kinds of body tissue, such as skin, teeth, hair, or cartilage. They usually do not cause symptoms unless they get very big.  Theca lutein cysts--These cysts occur when too much of a certain hormone (human chorionic gonadotropin) is produced and overstimulates the ovaries to produce an egg. This is most common after procedures used to assist with the conception of a baby (in vitro fertilization). CAUSES   Fertility drugs can cause a condition in which multiple large cysts are formed on the ovaries. This is called ovarian hyperstimulation syndrome.  A condition called polycystic ovary syndrome can cause hormonal imbalances that can lead to  nonfunctional ovarian cysts. SIGNS AND SYMPTOMS  Many ovarian cysts do not cause symptoms. If symptoms are present, they may include:  Pelvic pain or pressure.  Pain in the lower abdomen.  Pain during sexual intercourse.  Increasing girth (swelling) of the abdomen.  Abnormal menstrual periods.  Increasing pain with menstrual periods.  Stopping having menstrual periods without being pregnant. DIAGNOSIS  These cysts are commonly found during a routine or annual pelvic exam. Tests may be ordered to find out more about the cyst. These tests may include:  Ultrasound.  X-ray of the pelvis.  CT scan.  MRI.  Blood tests. TREATMENT  Many ovarian cysts go away on their own without treatment. Your health care provider may want to check your cyst regularly for 2-3 months to see if it changes. For women in menopause, it is particularly important to monitor a cyst closely because of the higher rate of ovarian cancer in menopausal women. When treatment is needed, it may include any of the following:  A procedure to drain the cyst (aspiration). This may be done using a long needle and ultrasound. It can also be done through a laparoscopic procedure. This involves using a thin, lighted tube with a tiny camera on the end (laparoscope) inserted through a small incision.  Surgery to remove the whole cyst. This may be done using laparoscopic surgery or an open surgery involving a larger incision in the lower abdomen.  Hormone treatment or birth control pills. These methods are sometimes used to help dissolve a cyst. HOME CARE INSTRUCTIONS   Only take over-the-counter   or prescription medicines as directed by your health care provider.  Follow up with your health care provider as directed.  Get regular pelvic exams and Pap tests. SEEK MEDICAL CARE IF:   Your periods are late, irregular, or painful, or they stop.  Your pelvic pain or abdominal pain does not go away.  Your abdomen becomes  larger or swollen.  You have pressure on your bladder or trouble emptying your bladder completely.  You have pain during sexual intercourse.  You have feelings of fullness, pressure, or discomfort in your stomach.  You lose weight for no apparent reason.  You feel generally ill.  You become constipated.  You lose your appetite.  You develop acne.  You have an increase in body and facial hair.  You are gaining weight, without changing your exercise and eating habits.  You think you are pregnant. SEEK IMMEDIATE MEDICAL CARE IF:   You have increasing abdominal pain.  You feel sick to your stomach (nauseous), and you throw up (vomit).  You develop a fever that comes on suddenly.  You have abdominal pain during a bowel movement.  Your menstrual periods become heavier than usual. MAKE SURE YOU:  Understand these instructions.  Will watch your condition.  Will get help right away if you are not doing well or get worse. Document Released: 09/14/2005 Document Revised: 09/19/2013 Document Reviewed: 05/22/2013 Exit[employer] Patient Information 2015 [employer]. This information is not intended to replace advice given to you by your health care provider. Make sure you discuss any questions you have with your health care provider.  

## 2014-08-13 NOTE — Progress Notes (Signed)
Patient ID: Shelley Higgins, female   DOB: Feb 10, 1981, 32 y.o.   MRN: [id number]  Chief Complaint  Patient presents with  . Dermoid cyst    HPI Shelley Higgins is a 33 y.o. female.  [id number] LMP 07/07/14 Referred due to [place] 06/04/14 right side. Some constipation and bloating   HPI  Past Medical History  Diagnosis Date  . TB (tuberculosis)   . Dermoid cyst   . Pneumonia     Past Surgical History  Procedure Laterality Date  . Induced abortion      Family History  Problem Relation Age of Onset  . Stroke Mother   . Hypertension Mother   . Diabetes Mother     Social History History  Substance Use Topics  . Smoking status: Current Every Day Smoker -- 0.50 packs/day    Types: Cigarettes  . Smokeless tobacco: Never Used  . Alcohol Use: Yes     Comment: occasional    No Known Allergies  Current Outpatient Prescriptions  Medication Sig Dispense Refill  . diazepam (VALIUM) 5 MG tablet Take 1 tablet (5 mg total) by mouth 2 (two) times daily. 10 tablet 0  . ibuprofen (ADVIL,MOTRIN) 200 MG tablet Take 200 mg by mouth every 6 (six) hours as needed for moderate pain.    . metroNIDAZOLE (METROGEL) 0.75 % vaginal gel   0  . Multiple Vitamin (MULTIVITAMIN WITH MINERALS) TABS tablet Take 1 tablet by mouth daily.    . naproxen (NAPROSYN) 500 MG tablet Take 1 tablet (500 mg total) by mouth 2 (two) times daily. 30 tablet 0   No current facility-administered medications for this visit.    Review of Systems Review of Systems  Constitutional: Negative.   Gastrointestinal: Positive for constipation and abdominal distention. Negative for nausea and diarrhea.  Genitourinary: Positive for vaginal discharge and menstrual problem (late menses ). Negative for pelvic pain.  Psychiatric/Behavioral: Negative.     Blood pressure 96/45, pulse 76, temperature 98.8 F (37.1 C).  Physical Exam Physical Exam  Constitutional: She is oriented to person, place, and time. She appears well-developed.  No distress.  Pulmonary/Chest: Effort normal.  Abdominal: Soft. She exhibits no mass. There is no tenderness.  Genitourinary: Vagina normal and uterus normal.  Pap done, no masses  Neurological: She is alert and oriented to person, place, and time.  Skin: Skin is warm and dry.  Psychiatric: She has a normal mood and affect. Her behavior is normal.    Data Reviewed  CLINICAL DATA: Abdominal and back pain for 12 hr.  EXAM: TRANSABDOMINAL AND TRANSVAGINAL ULTRASOUND OF PELVIS  TECHNIQUE: Both transabdominal and transvaginal ultrasound examinations of the pelvis were performed. Transabdominal technique was performed for global imaging of the pelvis including uterus, ovaries, adnexal regions, and pelvic cul-de-sac. It was necessary to proceed with endovaginal exam following the transabdominal exam to visualize the ovaries. S  COMPARISON: Ob sonogram 03/01/2008  FINDINGS: Uterus  Measurements: 8.5 x 3.8 x 5.2 cm. No fibroids or other mass visualized.  Endometrium  Thickness: 5.3 mm. No focal abnormality visualized.  Right ovary  Measurements: 3.7 x 2.5 x 3.4 cm. 2.5 x 2.2 x 2.1 cm and 1 x 0.9 x 0.9 cm echogenic structure within the right ovary.  Left ovary  Measurements: 4.3 x 2.6 x 2.8 cm. Normal appearance/no adnexal mass.  Other findings  Trace amount of fluid in the cul-de-sac.  IMPRESSION: Two echogenic structures within the right ovary as detailed above measuring up to 2.5 cm maximal dimension may represent dermoids.  To help confirm this and exclude other causes of these echogenic structures elected pelvic MR may be considered.  On limited color Doppler examination, flow is seen to the ovaries.   Electronically Signed  By: Chauncey Cruel M.D.  On: 06/04/2014 11:40     Assessment    Right ovarian cyst 2.5 mo ago     Plan    Repeat US        Shelley Higgins 08/13/2014, 2:04 PM

## 2014-08-15 LAB — CYTOLOGY - PAP

## 2014-08-16 ENCOUNTER — Ambulatory Visit (HOSPITAL_COMMUNITY)
Admission: RE | Admit: 2014-08-16 | Discharge: 2014-08-16 | Disposition: A | Discharge status: Home / Self Care | Payer: Medicaid Other | Source: Ambulatory Visit | Attending: Obstetrics & Gynecology | Admitting: Obstetrics & Gynecology

## 2014-08-16 DIAGNOSIS — R1909 Other intra-abdominal and pelvic swelling, mass and lump: Secondary | ICD-10-CM | POA: Diagnosis not present

## 2014-08-16 DIAGNOSIS — N831 Corpus luteum cyst: Secondary | ICD-10-CM | POA: Diagnosis not present

## 2014-08-16 DIAGNOSIS — N8320 Unspecified ovarian cysts: Secondary | ICD-10-CM | POA: Diagnosis present

## 2014-08-16 DIAGNOSIS — N83201 Unspecified ovarian cyst, right side: Secondary | ICD-10-CM

## 2014-08-20 ENCOUNTER — Telehealth: Payer: Self-pay | Admitting: *Deleted

## 2014-08-20 ENCOUNTER — Encounter: Payer: Self-pay | Admitting: Obstetrics & Gynecology

## 2014-08-20 NOTE — Telephone Encounter (Signed)
Message sent to front desk to schedule appointment and then we will call patient.

## 2014-08-20 NOTE — Telephone Encounter (Signed)
-----   Message from Woodroe Mode, MD sent at 08/17/2014 11:18 AM EST ----- RTC to discuss cyst on [place]

## 2014-08-21 ENCOUNTER — Encounter: Payer: Self-pay | Admitting: *Deleted

## 2014-08-21 NOTE — Telephone Encounter (Addendum)
-----   Message from Mallie Darting sent at 08/21/2014  2:16 PM EST ----- January 04 @12 :45  ----- Message -----    From: Celene Squibb Rash, LPN    Sent: [phone]/17/[phone]   9:10 AM      To: [facility] Admin Pool  Please schedule patient for a follow up appointment with Dr. Roselie Awkward to followup on ovarian cyst. Send back to clinical pool and we will call patient.  Thanks,   Estill Bamberg  1445 - Pt was informed of appt earlier today by Derinda Late, RN.   Juri Dinning RNC

## 2014-08-21 NOTE — Telephone Encounter (Signed)
-----   Message from Mallie Darting sent at 08/20/2014  2:58 PM EST ----- I made the appointment for 01/04 @12 :45  ----- Message -----    From: Celene Squibb Coral Soler, LPN    Sent: [phone]   9:10 AM      To: [facility] Admin Pool  Please schedule patient for a follow up appointment with Dr. Roselie Awkward to followup on ovarian cyst. Send back to clinical pool and we will call patient.  Thanks,   Estill Bamberg

## 2014-08-21 NOTE — Telephone Encounter (Signed)
Called patient and informed her of ultrasound and appt in our office. Patient verbalized understanding to all and had no other questions

## 2014-10-01 ENCOUNTER — Encounter: Payer: Self-pay | Admitting: Obstetrics & Gynecology

## 2014-10-01 ENCOUNTER — Ambulatory Visit (INDEPENDENT_AMBULATORY_CARE_PROVIDER_SITE_OTHER): Payer: Medicaid Other | Admitting: Obstetrics & Gynecology

## 2014-10-01 VITALS — BP 121/64 | HR 85 | Temp 98.6°F | Ht 65.0 in | Wt 191.1 lb

## 2014-10-01 DIAGNOSIS — N926 Irregular menstruation, unspecified: Secondary | ICD-10-CM | POA: Insufficient documentation

## 2014-10-01 MED ORDER — ETONOGESTREL-ETHINYL ESTRADIOL 0.12-0.015 MG/24HR VA RING: VAGINAL_RING | VAGINAL | Status: DC

## 2014-10-01 NOTE — Progress Notes (Signed)
Subjective:     Patient ID: Shelley Higgins, female   DOB: 1980-10-18, 34 y.o.   MRN: [id number]  HPI Patient's last menstrual period was 09/16/2014 (exact date). [id number] Irregular menses, no pain, wants BC and cycle control.   Review of Systems  Constitutional: Negative.   Genitourinary: Positive for menstrual problem (irregular). Negative for vaginal discharge and pelvic pain.  Musculoskeletal: Positive for back pain.       Objective:   Physical Exam  Constitutional: She is oriented to person, place, and time. She appears well-developed. No distress.  Neurological: She is alert and oriented to person, place, and time.  Psychiatric: She has a normal mood and affect. Her behavior is normal.    CLINICAL DATA: Follow-up right ovarian cyst  EXAM: ULTRASOUND PELVIS TRANSVAGINAL  TECHNIQUE: Transvaginal ultrasound examination of the pelvis was performed including evaluation of the uterus, ovaries, adnexal regions, and pelvic cul-de-sac.  COMPARISON: 06/04/2014  FINDINGS: Uterus  Measurements: 9.4 x 4.6 x 5.8 cm. No fibroids or other mass visualized.  Endometrium  Thickness: 8 mm. No focal abnormality visualized.  Right ovary  Measurements: 5.5 x 3.6 x 3.8 cm. 3.2 x 3.6 x 3.3 cm hyperechoic mass. Additional 9 x 9 x 7 mm hyperechoic mass.  Left ovary  Measurements: 4.5 x 2.2 x 3.7 cm. 1.9 cm corpus luteal cyst. 4 mm dystrophic calcification.  Other findings: No free fluid  IMPRESSION: 3.6 cm right ovarian dermoid.  Additional 9 mm probable right ovarian dermoid.        Assessment:     r ovarian dermoid no sx, irregular menses     Plan:     Nuvaring, repeat US 3 mo, RTC  Woodroe Mode, MD 10/01/2014

## 2014-10-01 NOTE — Patient Instructions (Signed)

## 2015-01-21 ENCOUNTER — Telehealth: Payer: Self-pay | Admitting: General Practice

## 2015-01-21 NOTE — Telephone Encounter (Signed)
-----   Message from What Cheer sent at 01/21/2015  4:22 PM EDT -----   ----- Message -----    From: Nicholes Rough, CMA    Sent: 01/17/2015  10:46 AM      To: Morley Kos  I think this was sent to the wrong clinical pool Thanks Margaretha Sheffield  ----- Message -----    From: Morley Kos    Sent: 01/17/2015  10:33 AM      To: [facility] Clinical Pool  Per Dr. Roselie Awkward pt is to return for u/s and rtc. Can you please schedule U/S for patient and send back to me so I can schedule follow up appointment.   Thank you for your help!

## 2015-01-21 NOTE — Telephone Encounter (Signed)
Ultrasound scheduled for 5/10 @ 130. Called patient, no answer- left message stating we are trying to reach you with some appts we have scheduled, please call us back at the clinics

## 2015-01-22 NOTE — Telephone Encounter (Signed)
Called patient and informed her of appts. Patient verbalized understanding and had no other questions

## 2015-02-05 ENCOUNTER — Ambulatory Visit (HOSPITAL_COMMUNITY): Payer: Medicaid Other

## 2015-02-11 ENCOUNTER — Ambulatory Visit: Payer: Medicaid Other | Admitting: Obstetrics & Gynecology

## 2015-02-11 ENCOUNTER — Ambulatory Visit (HOSPITAL_COMMUNITY)
Admission: RE | Admit: 2015-02-11 | Discharge: 2015-02-11 | Disposition: A | Discharge status: Home / Self Care | Payer: Medicaid Other | Source: Ambulatory Visit | Attending: Obstetrics & Gynecology | Admitting: Obstetrics & Gynecology

## 2015-02-11 DIAGNOSIS — N926 Irregular menstruation, unspecified: Secondary | ICD-10-CM | POA: Diagnosis not present

## 2015-03-01 ENCOUNTER — Ambulatory Visit: Payer: Medicaid Other | Admitting: Obstetrics & Gynecology

## 2015-03-01 ENCOUNTER — Telehealth: Payer: Self-pay | Admitting: *Deleted

## 2015-03-01 NOTE — Telephone Encounter (Signed)
Shelley Higgins missed an appointment for follow up after ultrasound for dermoid cyst. Discussed with Dr. Roselie Awkward and requested call patient and tell her ultrasound shows cyst is stable.  Called Shelley Higgins and informed her she missed her appointment for follow up and Dr. Roselie Awkward wanted me to call her and let her know he reviewed the ultrasound and it shows cyst is stable. She states she is doing fine, but wants him to remove the cyst. I informed her that would require her to reschedule her appointment and talk with Dr. Roselie Awkward about that face to face. I informed her I would have registrars call her next week with new appointment that will probably be weeks out. Shelley Higgins voices understanding .

## 2015-03-28 ENCOUNTER — Ambulatory Visit (INDEPENDENT_AMBULATORY_CARE_PROVIDER_SITE_OTHER): Payer: Self-pay | Admitting: Obstetrics & Gynecology

## 2015-03-28 ENCOUNTER — Encounter: Payer: Self-pay | Admitting: Obstetrics & Gynecology

## 2015-03-28 VITALS — BP 118/51 | HR 80 | Temp 99.1°F | Wt 191.4 lb

## 2015-03-28 DIAGNOSIS — N8320 Unspecified ovarian cysts: Secondary | ICD-10-CM

## 2015-03-28 DIAGNOSIS — N83201 Unspecified ovarian cyst, right side: Secondary | ICD-10-CM

## 2015-03-28 NOTE — Progress Notes (Signed)
Patient ID: Shelley Higgins, female   DOB: [birth year], 34 y.o.   MRN: [id number]  Chief Complaint  Patient presents with  . Ovarian Cyst  cc: bloating of abd with certain foods  HPI Shelley Higgins is a 34 y.o. female.  [id number] Patient's last menstrual period was 03/03/2015. Menses now regular, not using Nuvaring. [place] 01/2015 reviewed, results are stable  HPI  Past Medical History  Diagnosis Date  . TB (tuberculosis)   . Dermoid cyst   . Pneumonia     Past Surgical History  Procedure Laterality Date  . Induced abortion      Family History  Problem Relation Age of Onset  . Stroke Mother   . Hypertension Mother   . Diabetes Mother     Social History History  Substance Use Topics  . Smoking status: Current Every Day Smoker -- 0.50 packs/day    Types: Cigarettes  . Smokeless tobacco: Never Used  . Alcohol Use: Yes     Comment: occasional    No Known Allergies  Current Outpatient Prescriptions  Medication Sig Dispense Refill  . ibuprofen (ADVIL,MOTRIN) 200 MG tablet Take 200 mg by mouth every 6 (six) hours as needed for moderate pain.    . Multiple Vitamin (MULTIVITAMIN WITH MINERALS) TABS tablet Take 1 tablet by mouth daily.     No current facility-administered medications for this visit.    Review of Systems Review of Systems  Constitutional: Negative for unexpected weight change.  Gastrointestinal: Positive for abdominal distention. Negative for nausea, abdominal pain and constipation.  Genitourinary: Negative for vaginal discharge, menstrual problem and pelvic pain.    Blood pressure 118/51, pulse 80, temperature 99.1 F (37.3 C), temperature source Oral, weight 191 lb 6.4 oz (86.818 kg), last menstrual period 03/03/2015.  Physical Exam Physical Exam  Constitutional: She is oriented to person, place, and time. She appears well-developed. No distress.  Cardiovascular: Normal rate.   Pulmonary/Chest: Effort normal. No respiratory distress.  Abdominal: Soft.  She exhibits no distension and no mass. There is no tenderness.  Genitourinary:  deferred  Neurological: She is alert and oriented to person, place, and time.  Skin: Skin is warm and dry.  Psychiatric: She has a normal mood and affect. Her behavior is normal.    Data Reviewed  CLINICAL DATA: Irregular menstrual cycle.  EXAM: TRANSABDOMINAL AND TRANSVAGINAL ULTRASOUND OF PELVIS  TECHNIQUE: Both transabdominal and transvaginal ultrasound examinations of the pelvis were performed. Transabdominal technique was performed for global imaging of the pelvis including uterus, ovaries, adnexal regions, and pelvic cul-de-sac. It was necessary to proceed with endovaginal exam following the transabdominal exam to visualize the endometrium and right ovary.  COMPARISON: None  FINDINGS: Uterus  Measurements: 8.9 x 4.2 x 4.6 cm. No fibroids or other mass visualized.  Endometrium  Thickness: 3.6 mm. No focal abnormality visualized.  Right ovary  Measurements: 4.0 x 2.3 x 3.9 cm. Hypoechoic tubular structure within the right adnexa is identified. This measures approximately 1.9 x 0.9 x 2.6 cm. Two echogenic structures within the right ovary are noted measuring up to 2.4 x 2.5 x 2.1 cm the superior similar 04/25/2014 and are favored to represent dermoids. Normal appearance/no adnexal mass.  Left ovary  Measurements: 3.7 x 1.9 x 2.4 cm. Small calcification is identified. No adnexal mass.  Other findings  No free fluid.  IMPRESSION: 1. Normal appearance of the endometrium. If bleeding remains unresponsive to hormonal or medical therapy, sonohysterogram should be considered for focal lesion work-up. (Ref: Radiological Reasoning: Algorithmic Workup  of Abnormal Vaginal Bleeding with Endovaginal Sonography and Sonohysterography. AJR 2008; 426:S34-19) 2. Echogenic structures within the right ovary are again noted and appear similar to 06/04/2014 and are felt a likely  represent dermoids. 3. Anechoic tubular structure within the right adnexa may represent hydrosalpinx.   Electronically Signed  By: Kerby Moors M.D.  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 Establish PCP        ARNOLD,JAMES 03/28/2015, 3:35 PM

## 2015-06-06 ENCOUNTER — Emergency Department (HOSPITAL_COMMUNITY)
Admission: EM | Admit: 2015-06-06 | Discharge: 2015-06-06 | Disposition: A | Discharge status: Home / Self Care | Payer: Medicaid Other | Attending: Emergency Medicine | Admitting: Emergency Medicine

## 2015-06-06 ENCOUNTER — Encounter (HOSPITAL_COMMUNITY): Payer: Self-pay | Admitting: *Deleted

## 2015-06-06 DIAGNOSIS — K529 Noninfective gastroenteritis and colitis, unspecified: Secondary | ICD-10-CM | POA: Diagnosis not present

## 2015-06-06 DIAGNOSIS — Z79899 Other long term (current) drug therapy: Secondary | ICD-10-CM | POA: Insufficient documentation

## 2015-06-06 DIAGNOSIS — Z3202 Encounter for pregnancy test, result negative: Secondary | ICD-10-CM | POA: Insufficient documentation

## 2015-06-06 DIAGNOSIS — Z720 Tobacco use: Secondary | ICD-10-CM | POA: Diagnosis not present

## 2015-06-06 DIAGNOSIS — Z8611 Personal history of tuberculosis: Secondary | ICD-10-CM | POA: Diagnosis not present

## 2015-06-06 DIAGNOSIS — Z8701 Personal history of pneumonia (recurrent): Secondary | ICD-10-CM | POA: Insufficient documentation

## 2015-06-06 DIAGNOSIS — Z86018 Personal history of other benign neoplasm: Secondary | ICD-10-CM | POA: Diagnosis not present

## 2015-06-06 DIAGNOSIS — R109 Unspecified abdominal pain: Secondary | ICD-10-CM | POA: Diagnosis present

## 2015-06-06 LAB — COMPREHENSIVE METABOLIC PANEL WITH GFR
ALT: 50 U/L (ref 14–54)
AST: 46 U/L — ABNORMAL HIGH (ref 15–41)
Albumin: 4.1 g/dL (ref 3.5–5.0)
Alkaline Phosphatase: 47 U/L (ref 38–126)
Anion gap: 10 (ref 5–15)
BUN: 10 mg/dL (ref 6–20)
CO2: 27 mmol/L (ref 22–32)
Calcium: 9.4 mg/dL (ref 8.9–10.3)
Chloride: 98 mmol/L — ABNORMAL LOW (ref 101–111)
Creatinine, Ser: 0.91 mg/dL (ref 0.44–1.00)
GFR calc Af Amer: >60 mL/min (ref >60)
GFR calc non Af Amer: >60 mL/min (ref >60)
Glucose, Bld: 105 mg/dL — ABNORMAL HIGH (ref 65–99)
Potassium: 3.5 mmol/L (ref 3.5–5.1)
Sodium: 135 mmol/L (ref 135–145)
Total Bilirubin: 0.4 mg/dL (ref 0.3–1.2)
Total Protein: 7.2 g/dL (ref 6.5–8.1)

## 2015-06-06 LAB — CBC
HCT: 38.3 % (ref 36.0–46.0)
Hemoglobin: 12.9 g/dL (ref 12.0–15.0)
MCH: 28.6 pg (ref 26.0–34.0)
MCHC: 33.7 g/dL (ref 30.0–36.0)
MCV: 84.9 fL (ref 78.0–100.0)
PLATELETS: 269 10*3/uL (ref 150–400)
RBC: 4.51 MIL/uL (ref 3.87–5.11)
RDW: 12.2 % (ref 11.5–15.5)
WBC: 5.8 10*3/uL (ref 4.0–10.5)

## 2015-06-06 LAB — POC URINE PREG, ED: Preg Test, Ur: NEGATIVE

## 2015-06-06 LAB — URINALYSIS, ROUTINE W REFLEX MICROSCOPIC
Bilirubin Urine: NEGATIVE
Glucose, UA: NEGATIVE mg/dL
Ketones, ur: NEGATIVE mg/dL
Nitrite: NEGATIVE
Protein, ur: NEGATIVE mg/dL
Specific Gravity, Urine: 1.028 (ref 1.005–1.030)
Urobilinogen, UA: 0.2 mg/dL (ref 0.0–1.0)
pH: 5.5 (ref 5.0–8.0)

## 2015-06-06 LAB — URINE MICROSCOPIC-ADD ON

## 2015-06-06 LAB — LIPASE, BLOOD: Lipase: 26 U/L (ref 22–51)

## 2015-06-06 MED ORDER — DICYCLOMINE HCL 10 MG/ML IM SOLN
20.0000 mg | Freq: Once | INTRAMUSCULAR | Status: DC
  Filled 2015-06-06: qty 2

## 2015-06-06 MED ORDER — DICYCLOMINE HCL 10 MG PO CAPS
20.0000 mg | ORAL_CAPSULE | Freq: Once | ORAL | Status: AC
  Administered 2015-06-06: 20 mg via ORAL
  Filled 2015-06-06: qty 2

## 2015-06-06 MED ORDER — ONDANSETRON HCL 4 MG/2ML IJ SOLN
4.0000 mg | Freq: Once | INTRAMUSCULAR | Status: AC
  Administered 2015-06-06: 4 mg via INTRAVENOUS
  Filled 2015-06-06: qty 2

## 2015-06-06 MED ORDER — SODIUM CHLORIDE 0.9 % IV BOLUS (SEPSIS)
1000.0000 mL | Freq: Once | INTRAVENOUS | Status: AC
  Administered 2015-06-06: 1000 mL via INTRAVENOUS

## 2015-06-06 MED ORDER — KETOROLAC TROMETHAMINE 30 MG/ML IJ SOLN
30.0000 mg | Freq: Once | INTRAMUSCULAR | Status: AC
  Administered 2015-06-06: 30 mg via INTRAVENOUS
  Filled 2015-06-06: qty 1

## 2015-06-06 MED ORDER — DICYCLOMINE HCL 20 MG PO TABS: 20.0000 mg | ORAL_TABLET | Freq: Two times a day (BID) | ORAL | Status: DC

## 2015-06-06 MED ORDER — ONDANSETRON HCL 4 MG PO TABS: 4.0000 mg | ORAL_TABLET | Freq: Four times a day (QID) | ORAL | Status: DC

## 2015-06-06 NOTE — ED Notes (Signed)
Pt verbalized understanding of d/c instructions and has no further questions. Pt stable and nad.

## 2015-06-06 NOTE — Discharge Instructions (Signed)

## 2015-06-06 NOTE — ED Notes (Signed)
Pt c/o stomach cold x 3 days with fever, vomiting, diarrhea, abd pain. States that she is no longer having diarrhea.

## 2015-06-06 NOTE — ED Provider Notes (Signed)
CSN: [id number]     Arrival date & time 06/06/15  1748 History   First MD Initiated Contact with Patient 06/06/15 2032     Chief Complaint  Patient presents with  . Emesis  . Abdominal Pain    (Consider location/radiation/quality/duration/timing/severity/associated sxs/prior Treatment) HPI Comments: 34 year old female with a history of tuberculosis and dermoid cyst presents to the emergency department for nausea, vomiting, and diarrhea. Patient states that symptoms began 2 days ago. Patient recently had 3 episodes of emesis today. She denies any bloody emesis. She reports that emesis worsens with oral intake. She has not had any diarrhea since the middle of the day yesterday. Patient reports some right upper quadrant and right lower quadrant abdominal pain yesterday evening which has nearly resolved. She is now complaining of a frontal headache which feels similar to past headaches during moments when she does not eat regular meals. She reports a history of sick contacts, stating that her sisters have been sick with similar symptoms. She denies any known fever as well as any associated chest pain, shortness of breath, melanoma or hematochezia, or urinary symptoms. Patient denies a history of abdominal surgeries.  Patient is a 34 y.o. female presenting with vomiting and abdominal pain. The history is provided by the patient. No language interpreter was used.  Emesis Associated symptoms: abdominal pain and diarrhea   Abdominal Pain Associated symptoms: diarrhea, nausea and vomiting     Past Medical History  Diagnosis Date  . TB (tuberculosis)   . Dermoid cyst   . Pneumonia    Past Surgical History  Procedure Laterality Date  . Induced abortion     Family History  Problem Relation Age of Onset  . Stroke Mother   . Hypertension Mother   . Diabetes Mother    Social History  Substance Use Topics  . Smoking status: Current Every Day Smoker -- 0.50 packs/day    Types: Cigarettes  .  Smokeless tobacco: Never Used  . Alcohol Use: Yes     Comment: occasional   OB History    Gravida Para Term Preterm AB TAB SAB Ectopic Multiple Living   3 2 2  1 1    2       Review of Systems  Gastrointestinal: Positive for nausea, vomiting, abdominal pain and diarrhea.  All other systems reviewed and are negative.   Allergies  Review of patient's allergies indicates no known allergies.  Home Medications   Prior to Admission medications   Medication Sig Start Date End Date Taking? Authorizing Provider  etonogestrel-ethinyl estradiol (NUVARING) 0.12-0.015 MG/24HR vaginal ring Place 1 each vaginally every 28 (twenty-eight) days. Insert vaginally and leave in place for 3 consecutive weeks, then remove for 1 week.   Yes Historical Provider, MD  ibuprofen (ADVIL,MOTRIN) 200 MG tablet Take 200 mg by mouth every 6 (six) hours as needed for moderate pain.   Yes Historical Provider, MD  Multiple Vitamin (MULTIVITAMIN WITH MINERALS) TABS tablet Take 1 tablet by mouth daily.   Yes Historical Provider, MD  dicyclomine (BENTYL) 20 MG tablet Take 1 tablet (20 mg total) by mouth 2 (two) times daily. 06/06/15   Antonietta Breach, PA-C  ondansetron (ZOFRAN) 4 MG tablet Take 1 tablet (4 mg total) by mouth every 6 (six) hours. 06/06/15   Antonietta Breach, PA-C   BP 103/63 mmHg  Pulse 77  Temp(Src) 98.5 F (36.9 C) (Oral)  Resp 16  Ht 5\' 4"  (1.626 m)  Wt 191 lb (86.637 kg)  BMI 32.77 kg/m2  SpO2 100%   Physical Exam  Constitutional: She is oriented to person, place, and time. She appears well-developed and well-nourished. No distress.  Nontoxic/nonseptic appearing  HENT:  Head: Normocephalic and atraumatic.  Eyes: Conjunctivae and EOM are normal. No scleral icterus.  Neck: Normal range of motion.  Cardiovascular: Normal rate, regular rhythm and intact distal pulses.   Pulmonary/Chest: Effort normal and breath sounds normal. No respiratory distress. She has no wheezes. She has no rales.  Respirations even  and unlabored. Lungs clear.  Abdominal: Soft. She exhibits no distension. There is tenderness. There is no rebound and no guarding.  Generalized TTP noted in the RUQ, RLQ and suprapubic abdomen on deep palpation. No masses or peritoneal signs.  Musculoskeletal: Normal range of motion.  Neurological: She is alert and oriented to person, place, and time. She exhibits normal muscle tone. Coordination normal.  GCS 15. Patient moving all extremities. No focal neurologic deficits appreciated.  Skin: Skin is warm and dry. No rash noted. She is not diaphoretic. No erythema. No pallor.  Psychiatric: She has a normal mood and affect. Her behavior is normal.  Nursing note and vitals reviewed.   ED Course  Procedures (including critical care time) Labs Review Labs Reviewed  COMPREHENSIVE METABOLIC PANEL - Abnormal; Notable for the following:    Chloride 98 (*)    Glucose, Bld 105 (*)    AST 46 (*)    All other components within normal limits  URINALYSIS, ROUTINE W REFLEX MICROSCOPIC (NOT AT [facility]) - Abnormal; Notable for the following:    APPearance CLOUDY (*)    Hgb urine dipstick SMALL (*)    Leukocytes, UA TRACE (*)    All other components within normal limits  URINE MICROSCOPIC-ADD ON - Abnormal; Notable for the following:    Crystals CA OXALATE CRYSTALS (*)    All other components within normal limits  LIPASE, BLOOD  CBC  POC URINE PREG, ED    Imaging Review No results found.   I have personally reviewed and evaluated these images and lab results as part of my medical decision-making.   EKG Interpretation None      MDM   Final diagnoses:  Gastroenteritis    Patient with symptoms consistent with viral gastroenteritis. Vitals are stable, no fever. No clinical signs of dehydration; no tachycardia or hypotension. Lungs are clear. No focal abdominal pain. Abdominal exam is stable on reexamination. Doubt appendicitis, cholecystitis, pancreatitis, ruptured viscus, UTI, kidney stone,  or any other abdominal etiology. Patient reports hx of sick contacts (sisters) with similar symptoms.   Patient feels much improved after treatment with Bentyl, Zofran, Toradol, and IV fluids. She states that her headache has resolved. Patient stable for discharge at this time with instruction for outpatient management. Return precautions discussed and provided. Patient agreeable to plan with no unaddressed concerns. Patient discharged in good condition; VSS.   Filed Vitals:   06/06/15 1843 06/06/15 2030 06/06/15 2045 06/06/15 2130  BP: 126/72 105/76 114/68 103/63  Pulse: 83 88 82 77  Temp: 98.5 F (36.9 C)     TempSrc: Oral     Resp: 18  16   Height: 5\' 4"  (1.626 m)     Weight: 191 lb (86.637 kg)     SpO2: 95% 100% 100% 100%       Antonietta Breach, PA-C 06/06/15 [place], MD 06/07/15 0126

## 2015-07-03 ENCOUNTER — Emergency Department (INDEPENDENT_AMBULATORY_CARE_PROVIDER_SITE_OTHER): Payer: Medicaid Other

## 2015-07-03 ENCOUNTER — Emergency Department (INDEPENDENT_AMBULATORY_CARE_PROVIDER_SITE_OTHER)
Admission: EM | Admit: 2015-07-03 | Discharge: 2015-07-03 | Disposition: A | Discharge status: Home / Self Care | Payer: Medicaid Other | Source: Home / Self Care | Attending: Family Medicine | Admitting: Family Medicine

## 2015-07-03 ENCOUNTER — Encounter (HOSPITAL_COMMUNITY): Payer: Self-pay | Admitting: *Deleted

## 2015-07-03 DIAGNOSIS — S86912A Strain of unspecified muscle(s) and tendon(s) at lower leg level, left leg, initial encounter: Secondary | ICD-10-CM

## 2015-07-03 MED ORDER — MELOXICAM 7.5 MG PO TABS: 7.5000 mg | ORAL_TABLET | Freq: Two times a day (BID) | ORAL | Status: DC

## 2015-07-03 NOTE — ED Provider Notes (Signed)
CSN: [id number]     Arrival date & time 07/03/15  1339 History   First MD Initiated Contact with Patient 07/03/15 1639     Chief Complaint  Patient presents with  . Leg Pain   (Consider location/radiation/quality/duration/timing/severity/associated sxs/prior Treatment) Patient is a 34 y.o. female presenting with knee pain. The history is provided by the patient.  Knee Pain Location:  Knee Time since incident:  6 weeks Injury: yes   Mechanism of injury: fall   Fall:    Fall occurred:  From a stool (hanging curtains) Associated symptoms: no back pain     Past Medical History  Diagnosis Date  . TB (tuberculosis)   . Dermoid cyst   . Pneumonia    Past Surgical History  Procedure Laterality Date  . Induced abortion     Family History  Problem Relation Age of Onset  . Stroke Mother   . Hypertension Mother   . Diabetes Mother    Social History  Substance Use Topics  . Smoking status: Current Every Day Smoker -- 0.50 packs/day    Types: Cigarettes  . Smokeless tobacco: Never Used  . Alcohol Use: Yes     Comment: occasional   OB History    Gravida Para Term Preterm AB TAB SAB Ectopic Multiple Living   3 2 2  1 1    2      Review of Systems  Musculoskeletal: Positive for myalgias and gait problem. Negative for back pain and joint swelling.  Skin: Negative.   All other systems reviewed and are negative.   Allergies  Review of patient's allergies indicates no known allergies.  Home Medications   Prior to Admission medications   Medication Sig Start Date End Date Taking? Authorizing Provider  dicyclomine (BENTYL) 20 MG tablet Take 1 tablet (20 mg total) by mouth 2 (two) times daily. 06/06/15   Antonietta Breach, PA-C  etonogestrel-ethinyl estradiol (NUVARING) 0.12-0.015 MG/24HR vaginal ring Place 1 each vaginally every 28 (twenty-eight) days. Insert vaginally and leave in place for 3 consecutive weeks, then remove for 1 week.    Historical Provider, MD  ibuprofen  (ADVIL,MOTRIN) 200 MG tablet Take 200 mg by mouth every 6 (six) hours as needed for moderate pain.    Historical Provider, MD  meloxicam (MOBIC) 7.5 MG tablet Take 1 tablet (7.5 mg total) by mouth 2 (two) times daily after a meal. 07/03/15   Billy Fischer, MD  Multiple Vitamin (MULTIVITAMIN WITH MINERALS) TABS tablet Take 1 tablet by mouth daily.    Historical Provider, MD  ondansetron (ZOFRAN) 4 MG tablet Take 1 tablet (4 mg total) by mouth every 6 (six) hours. 06/06/15   Antonietta Breach, PA-C   Meds Ordered and Administered this Visit  Medications - No data to display  BP 115/73 mmHg  Pulse 82  Temp(Src) 98.6 F (37 C) (Oral)  Resp 16  SpO2 100%  LMP 06/21/2015 No data found.   Physical Exam  Constitutional: She is oriented to person, place, and time. She appears well-developed and well-nourished. No distress.  Musculoskeletal: She exhibits tenderness. She exhibits no edema.       Legs: Neurological: She is alert and oriented to person, place, and time.  Skin: Skin is warm and dry.  Nursing note and vitals reviewed.   ED Course  Procedures (including critical care time)  Labs Review Labs Reviewed - No data to display  Imaging Review Dg Knee Complete 4 Views Left  07/03/2015   CLINICAL DATA:  Pain for  6 weeks at back of knee from falling off a chair, was unable to bear weight for 3 days, no prior injuries  EXAM: LEFT KNEE - COMPLETE 4+ VIEW  COMPARISON:  None  FINDINGS: Bone mineralization normal.  Joint spaces preserved.  No fracture, dislocation, or bone destruction.  No joint effusion.  IMPRESSION: Normal exam.   Electronically Signed   By: Lavonia Dana M.D.   On: 07/03/2015 17:08   X-rays reviewed and report per radiologist.   Visual Acuity Review  Right Eye Distance:   Left Eye Distance:   Bilateral Distance:    Right Eye Near:   Left Eye Near:    Bilateral Near:         MDM   1. Muscle strain, lower leg, left, initial encounter    Knee sleeve, mobic, orthoped  referral.   Billy Fischer, MD 07/03/15 [phone]

## 2015-07-03 NOTE — Discharge Instructions (Signed)
Ice, medicine and knee support for comfort as needed, see orthopedist if further problems,

## 2015-07-03 NOTE — ED Notes (Signed)
Pt  Reports    Pain  l   Leg   She  States   She  Injured  The  Leg     6  Weeks  Ago  And    aggrevated      It  Yesterday       She  Reports  The  Pain is  Behind  The  l  Knee        Pt  Reports  The  Pain is  Cramping in nature

## 2015-10-03 ENCOUNTER — Other Ambulatory Visit: Payer: Self-pay | Admitting: Obstetrics & Gynecology

## 2016-05-30 ENCOUNTER — Emergency Department (HOSPITAL_COMMUNITY)
Admission: EM | Admit: 2016-05-30 | Discharge: 2016-05-30 | Disposition: A | Discharge status: Home / Self Care | Payer: Medicaid Other | Attending: Emergency Medicine | Admitting: Emergency Medicine

## 2016-05-30 ENCOUNTER — Emergency Department (HOSPITAL_COMMUNITY): Payer: Medicaid Other

## 2016-05-30 ENCOUNTER — Encounter (HOSPITAL_COMMUNITY): Payer: Self-pay | Admitting: Emergency Medicine

## 2016-05-30 DIAGNOSIS — F17210 Nicotine dependence, cigarettes, uncomplicated: Secondary | ICD-10-CM | POA: Insufficient documentation

## 2016-05-30 DIAGNOSIS — Z79899 Other long term (current) drug therapy: Secondary | ICD-10-CM | POA: Insufficient documentation

## 2016-05-30 DIAGNOSIS — T7849XA Other allergy, initial encounter: Secondary | ICD-10-CM | POA: Insufficient documentation

## 2016-05-30 DIAGNOSIS — J3089 Other allergic rhinitis: Secondary | ICD-10-CM

## 2016-05-30 DIAGNOSIS — X58XXXA Exposure to other specified factors, initial encounter: Secondary | ICD-10-CM | POA: Diagnosis not present

## 2016-05-30 DIAGNOSIS — R0602 Shortness of breath: Secondary | ICD-10-CM | POA: Diagnosis present

## 2016-05-30 MED ORDER — FAMOTIDINE 10 MG PO CHEW: 20.0000 mg | CHEWABLE_TABLET | Freq: Two times a day (BID) | ORAL | 0 refills | Status: DC | PRN

## 2016-05-30 MED ORDER — ALBUTEROL SULFATE HFA 108 (90 BASE) MCG/ACT IN AERS
2.0000 | INHALATION_SPRAY | RESPIRATORY_TRACT | Status: DC | PRN
  Administered 2016-05-30: 2 via RESPIRATORY_TRACT
  Filled 2016-05-30: qty 6.7

## 2016-05-30 NOTE — ED Triage Notes (Signed)
Pt. reports SOB /chest tightness after inhaling cigarette smoke filled game room this evening with no ventilation ,. respirations unlabored at arrival O2 sat=100% room air .

## 2016-05-30 NOTE — ED Notes (Signed)
PA at bedside.

## 2016-05-30 NOTE — ED Provider Notes (Signed)
[facility] DEPT Provider Note   CSN: [id number] Arrival date & time: 05/30/16  0135     History   Chief Complaint Chief Complaint  Patient presents with  . Shortness of Breath    HPI Shelley Higgins is a 35 y.o. female.  HPI  Patient presents to the emergency department for evaluation of shortness of breath that started just prior to arrival. She is a smoker and states that recently she has been getting irritation to the cigarette smoke. Her boyfriend also smokes and she states that when he smokes she has been becoming somewhat short of breath and wheezy. She states that typically if she gets away from the smoke and get some fresh air that her symptoms resolve on their own. Tonight she was in a smoky game room filled with cigarette smoke when she started to feel short of breath, wheezing, front and back pain. She started coughing and then felt dizzy. She states that on arrival to the emergency department the irritation to the smoke started to improve and then eventually resolved prior to my evaluation. She states that she's no longer having any symptoms in that she notices a cigarette smoke that she needs to stop smoking and not be in room with a lot of smoke.  She denies headaches, recent cough, nausea, vomiting, diarrhea, lower extremity swelling, chest pains.  Past Medical History:  Diagnosis Date  . Dermoid cyst   . Pneumonia   . TB (tuberculosis)     Patient Active Problem List   Diagnosis Date Noted  . Ovarian cyst 08/13/2014    Past Surgical History:  Procedure Laterality Date  . INDUCED ABORTION      OB History    Gravida Para Term Preterm AB Living   3 2 2   1 2    SAB TAB Ectopic Multiple Live Births     1             Home Medications    Prior to Admission medications   Medication Sig Start Date End Date Taking? Authorizing Provider  dicyclomine (BENTYL) 20 MG tablet Take 1 tablet (20 mg total) by mouth 2 (two) times daily. 06/06/15   Antonietta Breach, PA-C    etonogestrel-ethinyl estradiol (NUVARING) 0.12-0.015 MG/24HR vaginal ring Place 1 each vaginally every 28 (twenty-eight) days. Insert vaginally and leave in place for 3 consecutive weeks, then remove for 1 week.    Historical Provider, MD  ibuprofen (ADVIL,MOTRIN) 200 MG tablet Take 200 mg by mouth every 6 (six) hours as needed for moderate pain.    Historical Provider, MD  meloxicam (MOBIC) 7.5 MG tablet Take 1 tablet (7.5 mg total) by mouth 2 (two) times daily after a meal. 07/03/15   Billy Fischer, MD  Multiple Vitamin (MULTIVITAMIN WITH MINERALS) TABS tablet Take 1 tablet by mouth daily.    Historical Provider, MD  NUVARING 0.12-0.015 MG/24HR vaginal ring insert 1 ring vaginally for 3 weeks REMOVE for 1 week and repeat 10/15/15   Woodroe Mode, MD  ondansetron (ZOFRAN) 4 MG tablet Take 1 tablet (4 mg total) by mouth every 6 (six) hours. 06/06/15   Antonietta Breach, PA-C    Family History Family History  Problem Relation Age of Onset  . Stroke Mother   . Hypertension Mother   . Diabetes Mother     Social History Social History  Substance Use Topics  . Smoking status: Current Every Day Smoker    Types: Cigarettes  . Smokeless tobacco: Never Used  .  Alcohol use Yes     Allergies   Review of patient's allergies indicates no known allergies.   Review of Systems Review of Systems  Review of Systems All other systems negative except as documented in the HPI. All pertinent positives and negatives as reviewed in the HPI.   Physical Exam Updated Vital Signs BP 106/64   Pulse 67   Temp 98.1 F (36.7 C) (Oral)   Resp 19   Ht 5\' 4"  (1.626 m)   Wt 84.1 kg   LMP 05/25/2016 (Approximate)   SpO2 100%   BMI 31.84 kg/m   Physical Exam  Constitutional: She appears well-developed and well-nourished.  HENT:  Head: Normocephalic and atraumatic.  Eyes: Conjunctivae are normal. Pupils are equal, round, and reactive to light.  Neck: Trachea normal, normal range of motion and full passive  range of motion without pain. Neck supple.  Cardiovascular: Normal rate, regular rhythm and normal pulses.   Pulmonary/Chest: Effort normal and breath sounds normal. No respiratory distress. She has no wheezes. She has no rales. Chest wall is not dull to percussion. She exhibits no tenderness, no crepitus, no edema, no deformity and no retraction.  Abdominal: Soft. Normal appearance and bowel sounds are normal.  Musculoskeletal: Normal range of motion.  Neurological: She is alert. She has normal strength.  Skin: Skin is warm, dry and intact.  Psychiatric: She has a normal mood and affect. Her speech is normal and behavior is normal. Judgment and thought content normal. Cognition and memory are normal.     ED Treatments / Results  Labs (all labs ordered are listed, but only abnormal results are displayed) Labs Reviewed - No data to display  EKG  EKG Interpretation None       Radiology Dg Chest 2 View  Result Date: 05/30/2016 CLINICAL DATA:  Initial evaluation for acute shortness of breath, chest tightness. EXAM: CHEST  2 VIEW COMPARISON:  Prior radiograph from 07/07/2014. FINDINGS: The cardiac and mediastinal silhouettes are stable in size and contour, and remain within normal limits. The lungs are normally inflated. No airspace consolidation, pleural effusion, or pulmonary edema is identified. There is no pneumothorax. No acute osseous abnormality identified. IMPRESSION: No active cardiopulmonary disease. Electronically Signed   By: Jeannine Boga M.D.   On: 05/30/2016 02:07    Procedures Procedures (including critical care time)  Medications Ordered in ED Medications  albuterol (PROVENTIL HFA;VENTOLIN HFA) 108 (90 Base) MCG/ACT inhaler 2 puff (not administered)     Initial Impression / Assessment and Plan / ED Course  I have reviewed the triage vital signs and the nursing notes.  Pertinent labs & imaging results that were available during my care of the patient were  reviewed by me and considered in my medical decision making (see chart for details).  Clinical Course  Patient's lungs are clear to auscultation. We discussed smoking cessation and how being around cigarette smoke is also dangerous. She is likely developing a sensitivity to the smoke. I've given her an albuterol inhaler as needed for shortness of breath. We also discussed return precautions. Very low suspicion for ACS or PE. Chest x-ray is normal as well as oxygen saturation.  Final Clinical Impressions(s) / ED Diagnoses   Final diagnoses:  Smoke hypersensitivity    New Prescriptions New Prescriptions   No medications on file     Delos Haring, PA-C 05/30/16 [place], MD 05/30/16 [phone]

## 2016-12-10 ENCOUNTER — Encounter (HOSPITAL_COMMUNITY): Payer: Self-pay

## 2016-12-10 ENCOUNTER — Ambulatory Visit (HOSPITAL_COMMUNITY)
Admission: EM | Admit: 2016-12-10 | Discharge: 2016-12-10 | Disposition: A | Discharge status: Home / Self Care | Payer: Medicaid Other | Attending: Family Medicine | Admitting: Family Medicine

## 2016-12-10 DIAGNOSIS — K0889 Other specified disorders of teeth and supporting structures: Secondary | ICD-10-CM

## 2016-12-10 DIAGNOSIS — M542 Cervicalgia: Secondary | ICD-10-CM

## 2016-12-10 DIAGNOSIS — G44209 Tension-type headache, unspecified, not intractable: Secondary | ICD-10-CM

## 2016-12-10 MED ORDER — METHYLPREDNISOLONE 4 MG PO TBPK: ORAL_TABLET | ORAL | 0 refills | Status: DC

## 2016-12-10 MED ORDER — KETOROLAC TROMETHAMINE 60 MG/2ML IM SOLN: 60.0000 mg | Freq: Once | INTRAMUSCULAR | Status: DC

## 2016-12-10 MED ORDER — IBUPROFEN 800 MG PO TABS
ORAL_TABLET | ORAL | Status: AC
  Filled 2016-12-10: qty 1

## 2016-12-10 MED ORDER — CYCLOBENZAPRINE HCL 10 MG PO TABS: 10.0000 mg | ORAL_TABLET | Freq: Two times a day (BID) | ORAL | 0 refills | Status: DC | PRN

## 2016-12-10 MED ORDER — IBUPROFEN 800 MG PO TABS
800.0000 mg | ORAL_TABLET | Freq: Once | ORAL | Status: AC
  Administered 2016-12-10: 800 mg via ORAL

## 2016-12-10 MED ORDER — KETOROLAC TROMETHAMINE 60 MG/2ML IM SOLN
INTRAMUSCULAR | Status: AC
  Filled 2016-12-10: qty 2

## 2016-12-10 MED ORDER — AMOXICILLIN 875 MG PO TABS: 875.0000 mg | ORAL_TABLET | Freq: Two times a day (BID) | ORAL | 0 refills | Status: DC

## 2016-12-10 NOTE — ED Triage Notes (Signed)
Patient presents to [facility] with complaints of head pain, pt states she thinks pain originated from bad tooth in mouth, she states whenever she moves her head it feels like water shifting from one side to the other, stabbing pain back of neck. Pt denies any injuries

## 2016-12-10 NOTE — ED Provider Notes (Signed)
CSN: [id number]     Arrival date & time 12/10/16  1838 History   First MD Initiated Contact with Patient 12/10/16 1931     Chief Complaint  Patient presents with  . Headache   (Consider location/radiation/quality/duration/timing/severity/associated sxs/prior Treatment) Patient c/o dental pain, headache, neck discomfort that has worsened today.   The history is provided by the patient.  Headache  Pain location:  Occipital Quality:  Dull and stabbing Radiates to:  L neck and R neck Severity currently:  7/10 Severity at highest:  7/10 Onset quality:  Sudden Duration:  1 day Timing:  Constant Progression:  Worsening Chronicity:  New Similar to prior headaches: no   Relieved by:  Nothing Worsened by:  Nothing Associated symptoms: neck pain and neck stiffness     Past Medical History:  Diagnosis Date  . Dermoid cyst   . Pneumonia   . TB (tuberculosis)    Past Surgical History:  Procedure Laterality Date  . INDUCED ABORTION     Family History  Problem Relation Age of Onset  . Stroke Mother   . Hypertension Mother   . Diabetes Mother    Social History  Substance Use Topics  . Smoking status: Current Every Day Smoker    Types: Cigarettes  . Smokeless tobacco: Never Used  . Alcohol use Yes   OB History    Gravida Para Term Preterm AB Living   3 2 2   1 2    SAB TAB Ectopic Multiple Live Births     1           Review of Systems  Constitutional: Negative.   HENT: Positive for dental problem.   Eyes: Negative.   Respiratory: Negative.   Cardiovascular: Negative.   Gastrointestinal: Negative.   Endocrine: Negative.   Genitourinary: Negative.   Musculoskeletal: Positive for arthralgias, neck pain and neck stiffness.  Neurological: Positive for headaches.  Hematological: Negative.     Allergies  Patient has no known allergies.  Home Medications   Prior to Admission medications   Medication Sig Start Date End Date Taking? Authorizing Provider  amoxicillin  (AMOXIL) 875 MG tablet Take 1 tablet (875 mg total) by mouth 2 (two) times daily. 12/10/16   Lysbeth Penner, FNP  cyclobenzaprine (FLEXERIL) 10 MG tablet Take 1 tablet (10 mg total) by mouth 2 (two) times daily as needed for muscle spasms. 12/10/16   Lysbeth Penner, FNP  dicyclomine (BENTYL) 20 MG tablet Take 1 tablet (20 mg total) by mouth 2 (two) times daily. 06/06/15   Antonietta Breach, PA-C  etonogestrel-ethinyl estradiol (NUVARING) 0.12-0.015 MG/24HR vaginal ring Place 1 each vaginally every 28 (twenty-eight) days. Insert vaginally and leave in place for 3 consecutive weeks, then remove for 1 week.    Historical Provider, MD  famotidine (PEPCID AC) 10 MG chewable tablet Chew 2 tablets (20 mg total) by mouth 2 (two) times daily as needed for heartburn. 05/30/16   Tiffany Carlota Raspberry, PA-C  ibuprofen (ADVIL,MOTRIN) 200 MG tablet Take 200 mg by mouth every 6 (six) hours as needed for moderate pain.    Historical Provider, MD  meloxicam (MOBIC) 7.5 MG tablet Take 1 tablet (7.5 mg total) by mouth 2 (two) times daily after a meal. 07/03/15   Billy Fischer, MD  methylPREDNISolone (MEDROL DOSEPAK) 4 MG TBPK tablet Take 6-5-4-3-2-1 po qd 12/10/16   Lysbeth Penner, FNP  Multiple Vitamin (MULTIVITAMIN WITH MINERALS) TABS tablet Take 1 tablet by mouth daily.    Historical Provider, MD  NUVARING 0.12-0.015 MG/24HR vaginal ring insert 1 ring vaginally for 3 weeks REMOVE for 1 week and repeat 10/15/15   Woodroe Mode, MD  ondansetron (ZOFRAN) 4 MG tablet Take 1 tablet (4 mg total) by mouth every 6 (six) hours. 06/06/15   Antonietta Breach, PA-C   Meds Ordered and Administered this Visit   Medications  ketorolac (TORADOL) injection 60 mg (not administered)    BP 116/72 (BP Location: Right Arm)   Pulse 80   Temp 98.8 F (37.1 C) (Oral)   Resp 17   LMP 12/01/2016 (Exact Date)   SpO2 100%  No data found.   Physical Exam  Constitutional: She is oriented to person, place, and time. She appears well-developed and  well-nourished.  HENT:  Head: Normocephalic and atraumatic.  Poor dentition  Eyes: Conjunctivae and EOM are normal. Pupils are equal, round, and reactive to light.  Neck: Normal range of motion. Neck supple.  Cardiovascular: Normal rate, regular rhythm and normal heart sounds.   Pulmonary/Chest: Effort normal and breath sounds normal.  Abdominal: Soft. Bowel sounds are normal.  Musculoskeletal: She exhibits tenderness.  TTP bilateral cervical paraspinous muscles.  Neurological: She is alert and oriented to person, place, and time.  Nursing note and vitals reviewed.   Urgent Care Course     Procedures (including critical care time)  Labs Review Labs Reviewed - No data to display  Imaging Review No results found.   Visual Acuity Review  Right Eye Distance:   Left Eye Distance:   Bilateral Distance:    Right Eye Near:   Left Eye Near:    Bilateral Near:         MDM   1. Tension headache   2. Cervicalgia   3. Pain, dental    Medrol dose pack as directed 4mg  #21 Flexeril 10mg  one po bid prn #20 Amoxicillin 875mg  one po bid x 10 days #20       Lysbeth Penner, FNP 12/10/16 1943

## 2016-12-10 NOTE — ED Notes (Signed)
Patient decided against injection and agreeable to ibuprofen.  Bill oxford, np notified and agreeable to a change in orders.

## 2017-08-20 ENCOUNTER — Encounter (HOSPITAL_COMMUNITY): Payer: Self-pay | Admitting: Emergency Medicine

## 2017-08-20 ENCOUNTER — Emergency Department (HOSPITAL_COMMUNITY)
Admission: EM | Admit: 2017-08-20 | Discharge: 2017-08-20 | Disposition: A | Discharge status: Home / Self Care | Payer: Medicaid Other | Attending: Emergency Medicine | Admitting: Emergency Medicine

## 2017-08-20 ENCOUNTER — Other Ambulatory Visit: Payer: Self-pay

## 2017-08-20 DIAGNOSIS — Z79899 Other long term (current) drug therapy: Secondary | ICD-10-CM | POA: Diagnosis not present

## 2017-08-20 DIAGNOSIS — F17210 Nicotine dependence, cigarettes, uncomplicated: Secondary | ICD-10-CM | POA: Diagnosis not present

## 2017-08-20 DIAGNOSIS — K0889 Other specified disorders of teeth and supporting structures: Secondary | ICD-10-CM | POA: Insufficient documentation

## 2017-08-20 LAB — CBG MONITORING, ED: Glucose-Capillary: 105 mg/dL — ABNORMAL HIGH (ref 65–99)

## 2017-08-20 MED ORDER — PENICILLIN V POTASSIUM 500 MG PO TABS
500.0000 mg | ORAL_TABLET | Freq: Three times a day (TID) | ORAL | 0 refills | Status: DC
Start: 2017-08-20 — End: 2019-11-06

## 2017-08-20 MED ORDER — IBUPROFEN 400 MG PO TABS
400.0000 mg | ORAL_TABLET | Freq: Once | ORAL | Status: AC | PRN
  Administered 2017-08-20: 400 mg via ORAL
  Filled 2017-08-20: qty 1

## 2017-08-20 MED ORDER — PENICILLIN V POTASSIUM 250 MG PO TABS
500.0000 mg | ORAL_TABLET | Freq: Once | ORAL | Status: AC
  Administered 2017-08-20: 500 mg via ORAL
  Filled 2017-08-20: qty 2

## 2017-08-20 NOTE — ED Notes (Signed)
Patient given discharge instructions and verbalized understanding.  Patient stable to discharge at this time.  Patient is alert and oriented to baseline.  No distressed noted at this time.  All belongings taken with the patient at discharge.   

## 2017-08-20 NOTE — ED Provider Notes (Addendum)
[facility] Provider Note   CSN: [id number] Arrival date & time: 08/20/17  1224     History   Chief Complaint Chief Complaint  Patient presents with  . Headache  . Dental Pain  Complains of toothache at tooth #5 for the past 9 or 10 days.  Associated symptoms include subjective fever last time she had fever was 6 days ago.  She also had a single episode of dizziness meaning room spinning approximately a week ago which lasted 3 or 4 minutes.  She denies any blurred vision denies nausea or vomiting.  She is treat herself with ibuprofen with partial relief.  No other associated symptoms toothache causes entire right side of her head to hurt.  Pain is worse when she lies on her left side improved when she sits up HPI [facility] is a 36 y.o. female.  HPI  Past Medical History:  Diagnosis Date  . Dermoid cyst   . Pneumonia   . TB (tuberculosis)     Patient Active Problem List   Diagnosis Date Noted  . Ovarian cyst 08/13/2014    Past Surgical History:  Procedure Laterality Date  . INDUCED ABORTION      OB History    Gravida Para Term Preterm AB Living   3 2 2   1 2    SAB TAB Ectopic Multiple Live Births     1             Home Medications    Prior to Admission medications   Medication Sig Start Date End Date Taking? Authorizing Provider  amoxicillin (AMOXIL) 875 MG tablet Take 1 tablet (875 mg total) by mouth 2 (two) times daily. 12/10/16   Lysbeth Penner, FNP  cyclobenzaprine (FLEXERIL) 10 MG tablet Take 1 tablet (10 mg total) by mouth 2 (two) times daily as needed for muscle spasms. 12/10/16   Lysbeth Penner, FNP  dicyclomine (BENTYL) 20 MG tablet Take 1 tablet (20 mg total) by mouth 2 (two) times daily. 06/06/15   Antonietta Breach, PA-C  etonogestrel-ethinyl estradiol (NUVARING) 0.12-0.015 MG/24HR vaginal ring Place 1 each vaginally every 28 (twenty-eight) days. Insert vaginally and leave in place for 3 consecutive weeks, then  remove for 1 week.    [provider]  famotidine (PEPCID AC) 10 MG chewable tablet Chew 2 tablets (20 mg total) by mouth 2 (two) times daily as needed for heartburn. 05/30/16   Delos Haring, PA-C  ibuprofen (ADVIL,MOTRIN) 200 MG tablet Take 200 mg by mouth every 6 (six) hours as needed for moderate pain.    [provider]  meloxicam (MOBIC) 7.5 MG tablet Take 1 tablet (7.5 mg total) by mouth 2 (two) times daily after a meal. 07/03/15   Billy Fischer, MD  methylPREDNISolone (MEDROL DOSEPAK) 4 MG TBPK tablet Take 6-5-4-3-2-1 po qd 12/10/16   Lysbeth Penner, FNP  Multiple Vitamin (MULTIVITAMIN WITH MINERALS) TABS tablet Take 1 tablet by mouth daily.    [provider]  NUVARING 0.12-0.015 MG/24HR vaginal ring insert 1 ring vaginally for 3 weeks REMOVE for 1 week and repeat 10/15/15   Woodroe Mode, MD  ondansetron (ZOFRAN) 4 MG tablet Take 1 tablet (4 mg total) by mouth every 6 (six) hours. 06/06/15   Antonietta Breach, PA-C    Family History Family History  Problem Relation Age of Onset  . Stroke Mother   . Hypertension Mother   . Diabetes Mother     Social History Social History  Tobacco Use  . Smoking status: Current Every Day Smoker    Types: Cigarettes  . Smokeless tobacco: Never Used  Substance Use Topics  . Alcohol use: Yes  . Drug use: No     Allergies   Patient has no known allergies.   Review of Systems Review of Systems  Constitutional: Positive for fever.       Subjective fever several days ago  HENT: Positive for dental problem.   Respiratory: Negative.   Cardiovascular: Negative.   Gastrointestinal: Negative.   Musculoskeletal: Negative.   Skin: Negative.   Neurological: Positive for dizziness.  Psychiatric/Behavioral: Negative.   All other systems reviewed and are negative.    Physical Exam Updated Vital Signs BP (!) 100/58   Pulse 92   Temp 97.6 F (36.4 C) (Oral)   Resp 17   LMP 08/14/2017 (Exact Date)   SpO2 100%    Physical Exam  Constitutional: She is oriented to person, place, and time. She appears well-developed and well-nourished.  HENT:  Head: Normocephalic and atraumatic.  No facial asymmetry.  Several teeth missing.  Tooth #5 is tender.  No surrounding fluctuance or swelling of gingiva.  Bilateral tympanic membranes normal.  No trismus.  Oropharynx normal  Eyes: Conjunctivae are normal. Pupils are equal, round, and reactive to light.  Neck: Neck supple. No tracheal deviation present. No thyromegaly present.  Cardiovascular: Normal rate and regular rhythm.  No murmur heard. Pulmonary/Chest: Effort normal and breath sounds normal.  Abdominal: Soft. Bowel sounds are normal. She exhibits no distension. There is no tenderness.  Musculoskeletal: Normal range of motion. She exhibits no edema or tenderness.  Neurological: She is alert and oriented to person, place, and time. Coordination normal.  Gait normal Romberg normal pronator drift normal finger to nose normal  Skin: Skin is warm and dry. No rash noted.  Psychiatric: She has a normal mood and affect.  Nursing note and vitals reviewed.    ED Treatments / Results  Labs (all labs ordered are listed, but only abnormal results are displayed) Labs Reviewed  CBG MONITORING, ED  CBG MONITORING, ED    EKG  EKG Interpretation None      Results for orders placed or performed during the hospital encounter of 08/20/17  POC CBG, ED  Result Value Ref Range   Glucose-Capillary 105 (H) 65 - 99 mg/dL   No results found. Radiology No results found.  Procedures Procedures (including critical care time)  Medications Ordered in ED Medications  penicillin v potassium (VEETID) tablet 500 mg (not administered)  ibuprofen (ADVIL,MOTRIN) tablet 400 mg (400 mg Oral Given 08/20/17 1318)     Initial Impression / Assessment and Plan / ED Course  I have reviewed the triage vital signs and the nursing notes.  Pertinent labs & imaging results  that were available during my care of the patient were reviewed by me and considered in my medical decision making (see chart for details).     4:15 PM patient alert ambulates without difficulty.  Appears comfortable.  Glasgow Coma Score 15.  Blood sugar is normal received first dose of Pen-Vee K here.  Plan Tylenol or Advil ibuprofen for pain.  Scription Pen-Vee K.  Dental referral No evidence of stroke or systemic infection.  Patient appears well Final Clinical Impressions(s) / ED Diagnoses  Diagnosis dental pain Final diagnoses:  None    ED Discharge Orders    None       Orlie Dakin, MD 08/20/17 [place],  Shahrukh Pasch, MD 08/20/17 [phone]

## 2017-08-20 NOTE — ED Notes (Signed)
CBG taken at 16:05 is 105.   Pt stated she was not diabetic, but that her mother was. She said she's never been tested for diabetes either.

## 2017-08-20 NOTE — ED Triage Notes (Addendum)
Pt c/o R sided dental pain, L HA, L facial pain,  Longer than a week.  Pt reports subjective fever, intermittent blurred vision and dizziness, denies congestion or discharge. LSN greater than 1 week.

## 2017-08-20 NOTE — Discharge Instructions (Signed)
Take Tylenol or ibuprofen as directed for pain in addition to the penicillin prescribed.  Call any of the numbers on the resource guide on Monday, 08/23/2017 to get the next available dental appointment

## 2018-07-01 DIAGNOSIS — N898 Other specified noninflammatory disorders of vagina: Secondary | ICD-10-CM | POA: Diagnosis not present

## 2018-07-01 DIAGNOSIS — Z113 Encounter for screening for infections with a predominantly sexual mode of transmission: Secondary | ICD-10-CM | POA: Diagnosis not present

## 2018-08-30 ENCOUNTER — Encounter (HOSPITAL_COMMUNITY): Payer: Self-pay | Admitting: Emergency Medicine

## 2018-08-30 ENCOUNTER — Emergency Department (HOSPITAL_COMMUNITY): Payer: Medicaid Other

## 2018-08-30 ENCOUNTER — Emergency Department (HOSPITAL_COMMUNITY)
Admission: EM | Admit: 2018-08-30 | Discharge: 2018-08-30 | Disposition: A | Discharge status: Home / Self Care | Payer: Medicaid Other | Attending: Emergency Medicine | Admitting: Emergency Medicine

## 2018-08-30 ENCOUNTER — Other Ambulatory Visit: Payer: Self-pay

## 2018-08-30 DIAGNOSIS — M545 Low back pain, unspecified: Secondary | ICD-10-CM

## 2018-08-30 DIAGNOSIS — R1084 Generalized abdominal pain: Secondary | ICD-10-CM | POA: Diagnosis present

## 2018-08-30 DIAGNOSIS — D369 Benign neoplasm, unspecified site: Secondary | ICD-10-CM

## 2018-08-30 DIAGNOSIS — D270 Benign neoplasm of right ovary: Secondary | ICD-10-CM | POA: Diagnosis not present

## 2018-08-30 DIAGNOSIS — F17210 Nicotine dependence, cigarettes, uncomplicated: Secondary | ICD-10-CM | POA: Insufficient documentation

## 2018-08-30 DIAGNOSIS — R350 Frequency of micturition: Secondary | ICD-10-CM | POA: Insufficient documentation

## 2018-08-30 DIAGNOSIS — D279 Benign neoplasm of unspecified ovary: Secondary | ICD-10-CM | POA: Diagnosis not present

## 2018-08-30 DIAGNOSIS — N200 Calculus of kidney: Secondary | ICD-10-CM | POA: Diagnosis not present

## 2018-08-30 LAB — CBC
HCT: 41 % (ref 36.0–46.0)
Hemoglobin: 13 g/dL (ref 12.0–15.0)
MCH: 27.9 pg (ref 26.0–34.0)
MCHC: 31.7 g/dL (ref 30.0–36.0)
MCV: 88 fL (ref 80.0–100.0)
Platelets: 273 10*3/uL (ref 150–400)
RBC: 4.66 MIL/uL (ref 3.87–5.11)
RDW: 12.2 % (ref 11.5–15.5)
WBC: 6.6 10*3/uL (ref 4.0–10.5)
nRBC: 0 % (ref 0.0–0.2)

## 2018-08-30 LAB — WET PREP, GENITAL
Sperm: NONE SEEN
Trich, Wet Prep: NONE SEEN
Yeast Wet Prep HPF POC: NONE SEEN

## 2018-08-30 LAB — URINALYSIS, ROUTINE W REFLEX MICROSCOPIC
Bacteria, UA: NONE SEEN
Bilirubin Urine: NEGATIVE
Glucose, UA: NEGATIVE mg/dL
Hgb urine dipstick: NEGATIVE
Ketones, ur: NEGATIVE mg/dL
Nitrite: NEGATIVE
PROTEIN: NEGATIVE mg/dL
Specific Gravity, Urine: 1.015 (ref 1.005–1.030)
pH: 7 (ref 5.0–8.0)

## 2018-08-30 LAB — BASIC METABOLIC PANEL
Anion gap: 7 (ref 5–15)
BUN: 10 mg/dL (ref 6–20)
CO2: 27 mmol/L (ref 22–32)
Calcium: 9.3 mg/dL (ref 8.9–10.3)
Chloride: 101 mmol/L (ref 98–111)
Creatinine, Ser: 0.91 mg/dL (ref 0.44–1.00)
GFR calc Af Amer: >60 mL/min (ref >60)
GFR calc non Af Amer: >60 mL/min (ref >60)
Glucose, Bld: 95 mg/dL (ref 70–99)
Potassium: 4 mmol/L (ref 3.5–5.1)
Sodium: 135 mmol/L (ref 135–145)

## 2018-08-30 LAB — I-STAT BETA HCG BLOOD, ED (MC, WL, AP ONLY): I-stat hCG, quantitative: <5 m[IU]/mL (ref <5)

## 2018-08-30 MED ORDER — IOPAMIDOL (ISOVUE-300) INJECTION 61%
100.0000 mL | Freq: Once | INTRAVENOUS | Status: AC | PRN
  Administered 2018-08-30: 100 mL via INTRAVENOUS

## 2018-08-30 MED ORDER — FENTANYL CITRATE (PF) 100 MCG/2ML IJ SOLN
50.0000 ug | Freq: Once | INTRAMUSCULAR | Status: AC
  Administered 2018-08-30: 50 ug via INTRAVENOUS
  Filled 2018-08-30: qty 2

## 2018-08-30 MED ORDER — NAPROXEN 500 MG PO TABS: 500.0000 mg | ORAL_TABLET | Freq: Two times a day (BID) | ORAL | 0 refills | Status: DC

## 2018-08-30 MED ORDER — METHOCARBAMOL 500 MG PO TABS: 500.0000 mg | ORAL_TABLET | Freq: Three times a day (TID) | ORAL | 0 refills | Status: DC | PRN

## 2018-08-30 MED ORDER — SODIUM CHLORIDE 0.9 % IV BOLUS
500.0000 mL | Freq: Once | INTRAVENOUS | Status: AC
  Administered 2018-08-30: 500 mL via INTRAVENOUS

## 2018-08-30 NOTE — Discharge Instructions (Signed)
You were seen in the emergency department today for abdominal pain, increased urinary frequency, and back pain.  Your work-up in the ER was overall reassuring.  Your labs were normal with the exception of a very small amount of leukocytes in her urine, we are sending this for culture to determine if you potentially have a UTI, we will call you were treated with antibiotics if this is the case.  We also tested you for gonorrhea and chlamydia during your pelvic exam, we will call you if these test results are positive, if positive you will need to inform all sexual partners and seek treatment.  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  Your back pain seems muscular in nature.  We are sending you with medicines to help with the discomfort you have been having.  - Naproxen is a nonsteroidal anti-inflammatory medication that will help with pain and swelling. Be sure to take this medication as prescribed with food, 1 pill every 12 hours,  It should be taken with food, as it can cause stomach upset, and more seriously, stomach bleeding. Do not take other nonsteroidal anti-inflammatory medications with this such as Advil, Motrin, Aleve, Mobic, Goodie Powder, or Motrin.    - Robaxin is the muscle relaxer I have prescribed, this is meant to help with muscle tightness. Be aware that this medication may make you drowsy therefore the first time you take this it should be at a time you are in an environment where you can rest. Do not drive or operate heavy machinery when taking this medication. Do not drink alcohol or take other sedating medications with this medicine such as narcotics or benzodiazepines.   You make take Tylenol per over the counter dosing with these medications.   We have prescribed you new medication(s) today. Discuss the medications prescribed today with your pharmacist as they can have adverse effects and interactions with your other  medicines including over the counter and prescribed medications. Seek medical evaluation if you start to experience new or abnormal symptoms after taking one of these medicines, seek care immediately if you start to experience difficulty breathing, feeling of your throat closing, facial swelling, or rash as these could be indications of a more serious allergic reaction  Please follow-up with your OB/GYN provider within the next 1 week for reevaluation.  Return to the ER for new or worsening symptoms or any other concerns.

## 2018-08-30 NOTE — ED Triage Notes (Signed)
Onset 1 week ago developed right flank pain and recently pain increasing upon urinating. Pain currently 7/10 achy sore.

## 2018-08-30 NOTE — ED Provider Notes (Signed)
[facility] Provider Note   CSN: [id number] Arrival date & time: 08/30/18  0945     History   Chief Complaint Chief Complaint  Patient presents with  . Flank Pain  . Dysuria    HPI Shelley Higgins is a 37 y.o. female with a hx of tobacco abuse and prior TB who presents to the ED with complaints of abdominal pain x 2 weeks, and urinary sxs and lower back pain x 1 week. Patient states she has had a fairly constant pain to the RLQ of the abdomen for the past 2 weeks. She states pain is sharp in nature without specific alleviating/aggravating factors. Over the past 1 week she has noted increased urinary frequency and urgency as well as some lower back discomfort when she urinates. Other than urination no specific alleviating/aggravating factors. She reports subjective fever/chills recently without measured temps at home. Denies nausea, vomiting, diarrhea, dysuria, hematuria, vaginal bleeding, or vaginal discharge. Denies numbness, tingling, weakness, saddle anesthesia, incontinence to bowel/bladder, IV drug use, or hx of cancer. Patient has not had prior back surgeries.    HPI  Past Medical History:  Diagnosis Date  . Dermoid cyst   . Pneumonia   . TB (tuberculosis)     Patient Active Problem List   Diagnosis Date Noted  . Ovarian cyst 08/13/2014    Past Surgical History:  Procedure Laterality Date  . INDUCED ABORTION       OB History    Gravida  3   Para  2   Term  2   Preterm      AB  1   Living  2     SAB      TAB  1   Ectopic      Multiple      Live Births               Home Medications    Prior to Admission medications   Medication Sig Start Date End Date Taking? Authorizing Provider  amoxicillin (AMOXIL) 875 MG tablet Take 1 tablet (875 mg total) by mouth 2 (two) times daily. Patient not taking: Reported on 08/20/2017 12/10/16   Lysbeth Penner, FNP  cyclobenzaprine (FLEXERIL) 10 MG tablet Take 1  tablet (10 mg total) by mouth 2 (two) times daily as needed for muscle spasms. Patient not taking: Reported on 08/20/2017 12/10/16   Lysbeth Penner, FNP  dicyclomine (BENTYL) 20 MG tablet Take 1 tablet (20 mg total) by mouth 2 (two) times daily. Patient not taking: Reported on 08/20/2017 06/06/15   Antonietta Breach, PA-C  etonogestrel-ethinyl estradiol (NUVARING) 0.12-0.015 MG/24HR vaginal ring Place 1 each vaginally every 28 (twenty-eight) days. Insert vaginally and leave in place for 3 consecutive weeks, then remove for 1 week.    [provider]  famotidine (PEPCID AC) 10 MG chewable tablet Chew 2 tablets (20 mg total) by mouth 2 (two) times daily as needed for heartburn. Patient not taking: Reported on 08/20/2017 05/30/16   Delos Haring, PA-C  ibuprofen (ADVIL,MOTRIN) 200 MG tablet Take 200 mg by mouth every 6 (six) hours as needed for moderate pain.    [provider]  meloxicam (MOBIC) 7.5 MG tablet Take 1 tablet (7.5 mg total) by mouth 2 (two) times daily after a meal. Patient not taking: Reported on 08/20/2017 07/03/15   Billy Fischer, MD  methylPREDNISolone (MEDROL DOSEPAK) 4 MG TBPK tablet Take 6-5-4-3-2-1 po qd Patient not taking: Reported on 08/20/2017 12/10/16  Lysbeth Penner, FNP  NUVARING 0.12-0.015 MG/24HR vaginal ring insert 1 ring vaginally for 3 weeks REMOVE for 1 week and repeat Patient not taking: Reported on 08/20/2017 10/15/15   Woodroe Mode, MD  ondansetron (ZOFRAN) 4 MG tablet Take 1 tablet (4 mg total) by mouth every 6 (six) hours. Patient not taking: Reported on 08/20/2017 06/06/15   Antonietta Breach, PA-C  penicillin v potassium (VEETID) 500 MG tablet Take 1 tablet (500 mg total) by mouth 3 (three) times daily. X 7 days 08/20/17   Orlie Dakin, MD    Family History Family History  Problem Relation Age of Onset  . Stroke Mother   . Hypertension Mother   . Diabetes Mother     Social History Social History   Tobacco Use  . Smoking status:  Current Every Day Smoker    Types: Cigarettes  . Smokeless tobacco: Never Used  Substance Use Topics  . Alcohol use: Yes  . Drug use: No     Allergies   Patient has no known allergies.   Review of Systems Review of Systems  Constitutional: Positive for chills and fever (subjective). Negative for unexpected weight change.  Respiratory: Negative for shortness of breath.   Cardiovascular: Negative for chest pain.  Gastrointestinal: Positive for abdominal pain. Negative for blood in stool, constipation, diarrhea, nausea and vomiting.  Genitourinary: Positive for frequency and urgency. Negative for dysuria, vaginal bleeding and vaginal discharge.  Musculoskeletal: Positive for back pain.  Skin: Negative for wound.  Neurological: Negative for weakness and numbness.       Negative for incontinence or saddle anesthesia.   All other systems reviewed and are negative.    Physical Exam Updated Vital Signs BP 125/69 (BP Location: Right Arm)   Pulse 92   Temp 99.2 F (37.3 C) (Oral)   Resp 18   Ht 5' 4.75" (1.645 m)   Wt 88.5 kg   SpO2 100%   BMI 32.70 kg/m   Physical Exam  Constitutional: She appears well-developed and well-nourished.  Non-toxic appearance. No distress.  HENT:  Head: Normocephalic and atraumatic.  Eyes: Conjunctivae are normal. Right eye exhibits no discharge. Left eye exhibits no discharge.  Neck: Neck supple.  Cardiovascular: Normal rate and regular rhythm.  Pulmonary/Chest: Effort normal and breath sounds normal. No respiratory distress. She has no wheezes. She has no rhonchi. She has no rales.  Respiration even and unlabored  Abdominal: Soft. She exhibits no distension. There is tenderness (RLQ). There is no rigidity, no rebound, no guarding and no CVA tenderness.  Genitourinary:    Pelvic exam was performed with patient supine. There is no rash, tenderness or lesion on the right labia. There is no rash, tenderness or lesion on the left labia. Cervix  exhibits no motion tenderness and no friability. Right adnexum displays no mass, no tenderness and no fullness. Left adnexum displays no mass, no tenderness and no fullness. Vaginal discharge (White thin, mild amount) found.  Genitourinary Comments: EDT abigail present as chaperone.   Musculoskeletal:  Back: No warmth, rashes, ecchymosis, swelling, or obvious deformity. Diffuse lumbar tenderness. No midline point/focal tenderness.   Neurological: She is alert.  Clear speech. Sensation grossly intact to bilateral lower extremities. 5/5 strength with plantar/dorsiflexion. Patellar DTRs are 2+ symmetric. Ambulatory.   Skin: Skin is warm and dry. No rash noted.  Psychiatric: She has a normal mood and affect. Her behavior is normal.  Nursing note and vitals reviewed.    ED Treatments / Results  Labs (all  labs ordered are listed, but only abnormal results are displayed) Labs Reviewed  WET PREP, GENITAL - Abnormal; Notable for the following components:      Result Value   Clue Cells Wet Prep HPF POC PRESENT (*)    WBC, Wet Prep HPF POC MANY (*)    All other components within normal limits  URINALYSIS, ROUTINE W REFLEX MICROSCOPIC - Abnormal; Notable for the following components:   Leukocytes, UA SMALL (*)    All other components within normal limits  URINE CULTURE  CBC  BASIC METABOLIC PANEL  I-STAT BETA HCG BLOOD, ED ([facility], [facility], [facility] ONLY)  GC/CHLAMYDIA PROBE AMP ([place]) NOT AT [facility]    EKG None  Radiology Ct Abdomen Pelvis W Contrast  Result Date: 08/30/2018 CLINICAL DATA:  Abdominal pain and back pain for a week EXAM: CT ABDOMEN AND PELVIS WITH CONTRAST TECHNIQUE: Multidetector CT imaging of the abdomen and pelvis was performed using the standard protocol following bolus administration of intravenous contrast. CONTRAST:  151mL ISOVUE-300 IOPAMIDOL (ISOVUE-300) INJECTION 61% COMPARISON:  None. FINDINGS: Lower chest: The lung bases are clear. The heart is within normal limits in size.  Hepatobiliary: The liver enhances with no focal abnormality and no ductal dilatation is seen. No calcified gallstones are noted. Pancreas: The pancreas is normal in size and the pancreatic duct is not dilated. Spleen: The spleen is unremarkable. Adrenals/Urinary Tract: The adrenal glands appear normal. Kidneys enhance and there are nonobstructing small bilateral renal calculi present. The ureters appear normal in caliber to the bladder and the bladder is not well distended and cannot be well evaluated. Stomach/Bowel: The stomach is not distended but no gross abnormality is evident. No small bowel distention is seen. The colon is somewhat decompressed and there is feces throughout the more proximal colon. The terminal ileum and the appendix are unremarkable. Vascular/Lymphatic: The abdominal aorta is normal in caliber. No adenopathy is seen. Reproductive: The uterus is normal in size. However, posteriorly on the right there is a fat containing rounded mass within the posterior right adnexa adjacent to the rectosigmoid colon measuring 2.5 cm consistent with ovarian dermoid. There is a small amount of free fluid in the pelvis. Other: No abdominal wall hernia is seen. Musculoskeletal: The lumbar vertebrae are in normal alignment with normal intervertebral disc spaces. The SI joints appear well corticated. IMPRESSION: 1. 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 Small amount of free fluid in the pelvis. 2. Small bilateral nonobstructing renal calculi. 3. The appendix and terminal ileum are unremarkable. Electronically Signed   By: Ivar Drape M.D.   On: 08/30/2018 13:49    Procedures Procedures (including critical care time)  Medications Ordered in ED Medications - No data to display   Initial Impression / Assessment and Plan / ED Course  I have reviewed the triage vital signs and the nursing notes.  Pertinent labs & imaging results that were available during  my care of the patient were reviewed by me and considered in my medical decision making (see chart for details).    Patient presents to the ED with complaints of abdominal pain, back pain, and urinary frequency/urgency. Patient nontoxic appearing, in no apparent distress, vitals WNL. On exam patient tender to bilateral lumbar region as well as the abdominal RLQ, no peritoneal signs. Bimanual without CMT or adnexal tenderness. Will evaluate with labs and CT abdomen/pelvis especially given RLQ tenderness. Analgesics, anti-emetics, and fluids administered.   Labs reviewed and grossly unremarkable. No  leukocytosis, no anemia, no significant electrolyte derangements. Renal function WNL. Urinalysis without obvious infection, there are small amount of leukocytes- will culture based on symptoms, but unclear etiology to increased urinary frequency- no hyperglycemia or ketonuria to suggest new onset DM. Wet prep with BV, however patient without complaints of vaginal discharge therefore do not feel this needs treatment necessarily. GC/chlamydia pending, no CMT/adenxal tenderness, patient without concern for STD, doubt PID. Preg test negative- doubt ectopic. Imaging w/ CT abdomen/pelvis with ovarian dermoid posteriorly on the right w/ small amount of free fluid in the pelvis- thought to potentially be cause of patient's RLQ pelvic pain. There are small bilateral nonobstructing renal calculi- no ureteral calculi to cause urinary frequency/back pain. The appendix and terminal ileum are unremarkable. Back pain suspected to be musculoskeletal, no red flags, no abnormalities on CT noted to explain this. Will discharge home with naproxen/robaxin and obgyn follow up, instructed no driving/operative heavy machinery when taking robaxin. I discussed results, treatment plan, need for follow-up, and return precautions with the patient. Provided opportunity for questions, patient confirmed understanding and is in agreement with plan.     Final Clinical Impressions(s) / ED Diagnoses   Final diagnoses:  Dermoid cyst  Urinary frequency  Acute bilateral low back pain without sciatica    ED Discharge Orders         Ordered    naproxen (NAPROSYN) 500 MG tablet  2 times daily     08/30/18 1441    methocarbamol (ROBAXIN) 500 MG tablet  Every 8 hours PRN     08/30/18 1441           Miabella Shannahan, [place] R, PA-C 08/30/18 1443    Maudie Flakes, MD 08/30/18 2011

## 2018-08-30 NOTE — ED Notes (Signed)
Pt stable, ambulatory, states understanding of discharge instructions 

## 2018-08-31 LAB — URINE CULTURE: Culture: NO GROWTH

## 2018-08-31 LAB — GC/CHLAMYDIA PROBE AMP (~~LOC~~) NOT AT ARMC
Chlamydia: NEGATIVE
Neisseria Gonorrhea: NEGATIVE

## 2018-11-20 ENCOUNTER — Ambulatory Visit (INDEPENDENT_AMBULATORY_CARE_PROVIDER_SITE_OTHER): Payer: Medicaid Other

## 2018-11-20 ENCOUNTER — Ambulatory Visit (HOSPITAL_COMMUNITY)
Admission: EM | Admit: 2018-11-20 | Discharge: 2018-11-20 | Disposition: A | Discharge status: Home / Self Care | Payer: Medicaid Other | Attending: Emergency Medicine | Admitting: Emergency Medicine

## 2018-11-20 ENCOUNTER — Encounter (HOSPITAL_COMMUNITY): Payer: Self-pay | Admitting: Emergency Medicine

## 2018-11-20 DIAGNOSIS — S6991XA Unspecified injury of right wrist, hand and finger(s), initial encounter: Secondary | ICD-10-CM | POA: Diagnosis not present

## 2018-11-20 DIAGNOSIS — S63654A Sprain of metacarpophalangeal joint of right ring finger, initial encounter: Secondary | ICD-10-CM

## 2018-11-20 DIAGNOSIS — M79641 Pain in right hand: Secondary | ICD-10-CM | POA: Diagnosis not present

## 2018-11-20 DIAGNOSIS — X500XXA Overexertion from strenuous movement or load, initial encounter: Secondary | ICD-10-CM

## 2018-11-20 DIAGNOSIS — M7989 Other specified soft tissue disorders: Secondary | ICD-10-CM | POA: Diagnosis not present

## 2018-11-20 MED ORDER — MELOXICAM 7.5 MG PO TABS: 7.5000 mg | ORAL_TABLET | Freq: Every day | ORAL | 0 refills | Status: DC

## 2018-11-20 NOTE — Discharge Instructions (Signed)
Ice and elevation of affected area.  Use of splint as provided for comfort.  Meloxicam daily to help with pain, don't take additional ibuprofen.  Please follow up with orthopedics/hand specialist for further evaluation.

## 2018-11-20 NOTE — ED Triage Notes (Signed)
Pt here for right ring finger injury 2 weeks ago with continued swelling

## 2018-11-20 NOTE — ED Provider Notes (Signed)
[facility]    CSN: [id number] Arrival date & time: 11/20/18  1220     History   Chief Complaint Chief Complaint  Patient presents with  . Finger Injury    HPI Shelley Higgins is a 38 y.o. female.   Shelley Higgins presents with complaints of right hand ring finger pain for approximately 11 days. States started when she was moving, lifting and moving boxes etc. No specific known injury to the finger. She noted aching and had swelling. This has improved some but still with some pain and swelling. Has been applying heat and ice. Has been taking advil, last last night, which helps some. Pain 6/10. No history of gout. No fevers. No open skin or break in skin. No redness. She is right handed. No previous injury to the hand or finger. She is not currently on any medications.     ROS per HPI.      Past Medical History:  Diagnosis Date  . Dermoid cyst   . Pneumonia   . TB (tuberculosis)     Patient Active Problem List   Diagnosis Date Noted  . Ovarian cyst 08/13/2014    Past Surgical History:  Procedure Laterality Date  . INDUCED ABORTION      OB History    Gravida  3   Para  2   Term  2   Preterm      AB  1   Living  2     SAB      TAB  1   Ectopic      Multiple      Live Births               Home Medications    Prior to Admission medications   Medication Sig Start Date End Date Taking? Authorizing Provider  etonogestrel-ethinyl estradiol (NUVARING) 0.12-0.015 MG/24HR vaginal ring Place 1 each vaginally every 28 (twenty-eight) days. Insert vaginally and leave in place for 3 consecutive weeks, then remove for 1 week.    [provider]  ibuprofen (ADVIL,MOTRIN) 200 MG tablet Take 200 mg by mouth every 6 (six) hours as needed for moderate pain.    [provider]  meloxicam (MOBIC) 7.5 MG tablet Take 1 tablet (7.5 mg total) by mouth daily. 11/20/18   Zigmund Gottron, NP  methocarbamol (ROBAXIN) 500 MG tablet Take 1 tablet  (500 mg total) by mouth every 8 (eight) hours as needed for muscle spasms. 08/30/18   Petrucelli, Samantha R, PA-C  penicillin v potassium (VEETID) 500 MG tablet Take 1 tablet (500 mg total) by mouth 3 (three) times daily. X 7 days 08/20/17   Orlie Dakin, MD    Family History Family History  Problem Relation Age of Onset  . Stroke Mother   . Hypertension Mother   . Diabetes Mother     Social History Social History   Tobacco Use  . Smoking status: Current Every Day Smoker    Types: Cigarettes  . Smokeless tobacco: Never Used  Substance Use Topics  . Alcohol use: Yes  . Drug use: No     Allergies   Patient has no known allergies.   Review of Systems Review of Systems   Physical Exam Triage Vital Signs ED Triage Vitals  Enc Vitals Group     BP 11/20/18 1242 121/61     Pulse Rate 11/20/18 1242 91     Resp 11/20/18 1242 18     Temp 11/20/18 1242 99.3 F (37.4 C)  Temp Source 11/20/18 1242 Oral     SpO2 11/20/18 1242 96 %     Weight --      Height --      Head Circumference --      Peak Flow --      Pain Score 11/20/18 1243 4     Pain Loc --      Pain Edu? --      Excl. in [place]? --    No data found.  Updated Vital Signs BP 121/61 (BP Location: Right Arm)   Pulse 91   Temp 99.3 F (37.4 C) (Oral)   Resp 18   SpO2 96%   Visual Acuity Right Eye Distance:   Left Eye Distance:   Bilateral Distance:    Right Eye Near:   Left Eye Near:    Bilateral Near:     Physical Exam Constitutional:      General: She is not in acute distress.    Appearance: She is well-developed.  Cardiovascular:     Rate and Rhythm: Normal rate and regular rhythm.     Heart sounds: Normal heart sounds.  Pulmonary:     Effort: Pulmonary effort is normal.     Breath sounds: Normal breath sounds.  Musculoskeletal:     Right hand: She exhibits decreased range of motion, tenderness, bony tenderness and swelling. She exhibits normal two-point discrimination, normal capillary  refill, no deformity and no laceration. Normal sensation noted. Normal strength noted.     Comments: Right proximal ring finger with swelling and mild tenderness along medial and lateral aspect of proximal phalanx; mild tenderness and pain at ring finger MCP joint; full ROM in all directions at MCP joint but with pain with making a fist; no popping or catching of the joint; no visible tendonous abnormality; no redness; no weakness to ring finger in any direction against resistance; cap refill < 2 seconds    Skin:    General: Skin is warm and dry.  Neurological:     Mental Status: She is alert and oriented to person, place, and time.      UC Treatments / Results  Labs (all labs ordered are listed, but only abnormal results are displayed) Labs Reviewed - No data to display  EKG None  Radiology Dg Hand Complete Right  Result Date: 11/20/2018 CLINICAL DATA:  Patient states that she injured her right hand x 2 weeks ago moving furniture, pain and swelling along the 4th digit. No Hx EXAM: RIGHT HAND - COMPLETE 3+ VIEW COMPARISON:  None. FINDINGS: No acute fracture or dislocation.  No definite soft tissue swelling. IMPRESSION: No acute osseous abnormality. Electronically Signed   By: Abigail Miyamoto M.D.   On: 11/20/2018 13:29    Procedures Procedures (including critical care time)  Medications Ordered in UC Medications - No data to display  Initial Impression / Assessment and Plan / UC Course  I have reviewed the triage vital signs and the nursing notes.  Pertinent labs & imaging results that were available during my care of the patient were reviewed by me and considered in my medical decision making (see chart for details).     Suspect strain vs tendonitis to right hand ring finger. No redness or open skin. Xray reassuring. Splint placed. Ice, elevation, nsaids for pain. Follow up with hand for persistent symptoms. Patient verbalized understanding and agreeable to plan.   Final Clinical  Impressions(s) / UC Diagnoses   Final diagnoses:  Sprain of metacarpophalangeal (MCP) joint of  right ring finger, initial encounter     Discharge Instructions     Ice and elevation of affected area.  Use of splint as provided for comfort.  Meloxicam daily to help with pain, don't take additional ibuprofen.  Please follow up with orthopedics/hand specialist for further evaluation.    ED Prescriptions    Medication Sig Dispense Auth. Provider   meloxicam (MOBIC) 7.5 MG tablet Take 1 tablet (7.5 mg total) by mouth daily. 20 tablet Zigmund Gottron, NP     Controlled Substance Prescriptions [place] Controlled Substance Registry consulted? Not Applicable   Zigmund Gottron, NP 11/20/18 1339

## 2019-03-29 ENCOUNTER — Other Ambulatory Visit: Payer: Self-pay | Admitting: *Deleted

## 2019-03-29 ENCOUNTER — Telehealth: Payer: Self-pay | Admitting: *Deleted

## 2019-03-29 DIAGNOSIS — Z20822 Contact with and (suspected) exposure to covid-19: Secondary | ICD-10-CM

## 2019-03-29 NOTE — Telephone Encounter (Signed)
Attempted to contact pt to schedule COVID testing per Nancy Fetter, NP; left message on voicemail.

## 2019-03-30 NOTE — Telephone Encounter (Signed)
Spoke with pt's supervisor Jen Mow regarding scheduling appt.LVM x 2. Contact number verified. Supervisor states that she has not been able to contact the pt to schedule testing.

## 2019-03-30 NOTE — Telephone Encounter (Signed)
Pt called and left message to return call to schedule testing.

## 2019-06-26 ENCOUNTER — Emergency Department (HOSPITAL_COMMUNITY)
Admission: EM | Admit: 2019-06-26 | Discharge: 2019-06-26 | Disposition: A | Discharge status: Home / Self Care | Payer: Medicaid Other | Attending: Emergency Medicine | Admitting: Emergency Medicine

## 2019-06-26 ENCOUNTER — Other Ambulatory Visit: Payer: Self-pay

## 2019-06-26 DIAGNOSIS — J029 Acute pharyngitis, unspecified: Secondary | ICD-10-CM | POA: Diagnosis not present

## 2019-06-26 DIAGNOSIS — K2900 Acute gastritis without bleeding: Secondary | ICD-10-CM

## 2019-06-26 DIAGNOSIS — F17210 Nicotine dependence, cigarettes, uncomplicated: Secondary | ICD-10-CM | POA: Diagnosis not present

## 2019-06-26 DIAGNOSIS — R109 Unspecified abdominal pain: Secondary | ICD-10-CM | POA: Diagnosis present

## 2019-06-26 LAB — COMPREHENSIVE METABOLIC PANEL
ALT: 20 U/L (ref 0–44)
AST: 20 U/L (ref 15–41)
Albumin: 4 g/dL (ref 3.5–5.0)
Alkaline Phosphatase: 41 U/L (ref 38–126)
Anion gap: 8 (ref 5–15)
BUN: 8 mg/dL (ref 6–20)
CO2: 28 mmol/L (ref 22–32)
Calcium: 9.2 mg/dL (ref 8.9–10.3)
Chloride: 99 mmol/L (ref 98–111)
Creatinine, Ser: 1.03 mg/dL — ABNORMAL HIGH (ref 0.44–1.00)
GFR calc Af Amer: >60 mL/min (ref >60)
GFR calc non Af Amer: >60 mL/min (ref >60)
Glucose, Bld: 98 mg/dL (ref 70–99)
Potassium: 4 mmol/L (ref 3.5–5.1)
Sodium: 135 mmol/L (ref 135–145)
Total Bilirubin: 0.4 mg/dL (ref 0.3–1.2)
Total Protein: 7.1 g/dL (ref 6.5–8.1)

## 2019-06-26 LAB — CBC
HCT: 41 % (ref 36.0–46.0)
Hemoglobin: 13.1 g/dL (ref 12.0–15.0)
MCH: 28.3 pg (ref 26.0–34.0)
MCHC: 32 g/dL (ref 30.0–36.0)
MCV: 88.6 fL (ref 80.0–100.0)
Platelets: 270 10*3/uL (ref 150–400)
RBC: 4.63 MIL/uL (ref 3.87–5.11)
RDW: 12.3 % (ref 11.5–15.5)
WBC: 6.2 10*3/uL (ref 4.0–10.5)
nRBC: 0 % (ref 0.0–0.2)

## 2019-06-26 LAB — URINALYSIS, ROUTINE W REFLEX MICROSCOPIC
Bacteria, UA: NONE SEEN
Bilirubin Urine: NEGATIVE
Glucose, UA: NEGATIVE mg/dL
Hgb urine dipstick: NEGATIVE
Ketones, ur: NEGATIVE mg/dL
Leukocytes,Ua: NEGATIVE
Nitrite: NEGATIVE
Protein, ur: NEGATIVE mg/dL
Specific Gravity, Urine: 1.006 (ref 1.005–1.030)
pH: 6 (ref 5.0–8.0)

## 2019-06-26 LAB — LIPASE, BLOOD: Lipase: 31 U/L (ref 11–51)

## 2019-06-26 LAB — I-STAT BETA HCG BLOOD, ED (MC, WL, AP ONLY): I-stat hCG, quantitative: <5 m[IU]/mL (ref <5)

## 2019-06-26 LAB — GROUP A STREP BY PCR: Group A Strep by PCR: NOT DETECTED

## 2019-06-26 MED ORDER — FAMOTIDINE 20 MG PO TABS: 20.0000 mg | ORAL_TABLET | Freq: Two times a day (BID) | ORAL | 0 refills | Status: DC

## 2019-06-26 MED ORDER — FAMOTIDINE 20 MG PO TABS
20.0000 mg | ORAL_TABLET | Freq: Once | ORAL | Status: AC
  Administered 2019-06-26: 20 mg via ORAL
  Filled 2019-06-26: qty 1

## 2019-06-26 MED ORDER — SODIUM CHLORIDE 0.9% FLUSH: 3.0000 mL | Freq: Once | INTRAVENOUS | Status: DC

## 2019-06-26 NOTE — ED Triage Notes (Signed)
Pt here for evaluation of burning upper abdominal pain/burning and burning in throat x 1 week. Abdominal pain worse after eating. Endorses multiple episodes of emesis three days ago but none since.

## 2019-06-26 NOTE — Discharge Instructions (Addendum)
Please read attached information. If you experience any new or worsening signs or symptoms please return to the emergency room for evaluation. Please follow-up with your primary care provider or specialist as discussed. Please use medication prescribed only as directed and discontinue taking if you have any concerning signs or symptoms.   °

## 2019-06-26 NOTE — ED Provider Notes (Signed)
[facility] EMERGENCY DEPARTMENT Provider Note   CSN: [id number] Arrival date & time: 06/26/19  1114     History   Chief Complaint Chief Complaint  Patient presents with  . Abdominal Pain  . Sore Throat    HPI Shelley Higgins is a 38 y.o. female.     HPI    38 year old female presents today with complaints of abdominal and throat pain.  Patient notes over the last several weeks she has had intermittent pain in her epigastric abdominal region.  She notes that when she eats or drinks anything she has pain in her throat and also pain in her upper stomach.  She notes the throat pain feels similar to when she had strep throat, also notes some erythema and white spots in the back of her throat that have now since resolved after her cleaning the back of her throat.  She denies any fever.  She denies any lower abdominal pain.  She is not pregnant or breast-feeding.  She has a history of acid reflux but notes this feels different.  She notes using Tums without improvement in her symptoms.  She denies any dark tarry stools.  No heavy alcohol or acetaminophen/aspirin use.   Past Medical History:  Diagnosis Date  . Dermoid cyst   . Pneumonia   . TB (tuberculosis)     Patient Active Problem List   Diagnosis Date Noted  . Ovarian cyst 08/13/2014    Past Surgical History:  Procedure Laterality Date  . INDUCED ABORTION       OB History    Gravida  3   Para  2   Term  2   Preterm      AB  1   Living  2     SAB      TAB  1   Ectopic      Multiple      Live Births               Home Medications    Prior to Admission medications   Medication Sig Start Date End Date Taking? Authorizing Provider  etonogestrel-ethinyl estradiol (NUVARING) 0.12-0.015 MG/24HR vaginal ring Place 1 each vaginally every 28 (twenty-eight) days. Insert vaginally and leave in place for 3 consecutive weeks, then remove for 1 week.    [provider]  famotidine  (PEPCID) 20 MG tablet Take 1 tablet (20 mg total) by mouth 2 (two) times daily. 06/26/19   Della Scrivener, Dellis Filbert, PA-C  ibuprofen (ADVIL,MOTRIN) 200 MG tablet Take 200 mg by mouth every 6 (six) hours as needed for moderate pain.    [provider]  meloxicam (MOBIC) 7.5 MG tablet Take 1 tablet (7.5 mg total) by mouth daily. 11/20/18   Zigmund Gottron, NP  methocarbamol (ROBAXIN) 500 MG tablet Take 1 tablet (500 mg total) by mouth every 8 (eight) hours as needed for muscle spasms. 08/30/18   Petrucelli, Samantha R, PA-C  penicillin v potassium (VEETID) 500 MG tablet Take 1 tablet (500 mg total) by mouth 3 (three) times daily. X 7 days 08/20/17   Orlie Dakin, MD    Family History Family History  Problem Relation Age of Onset  . Stroke Mother   . Hypertension Mother   . Diabetes Mother     Social History Social History   Tobacco Use  . Smoking status: Current Every Day Smoker    Types: Cigarettes  . Smokeless tobacco: Never Used  Substance Use Topics  . Alcohol use:  Yes  . Drug use: No     Allergies   Patient has no known allergies.   Review of Systems Review of Systems  All other systems reviewed and are negative.    Physical Exam Updated Vital Signs BP 113/66 (BP Location: Right Arm)   Pulse 78   Temp 99.1 F (37.3 C) (Oral)   Resp 16   LMP 06/09/2019   SpO2 94%   Physical Exam Vitals signs and nursing note reviewed.  Constitutional:      Appearance: She is well-developed.  HENT:     Head: Normocephalic and atraumatic.     Comments: Oropharynx clear with no erythema exudate no tonsillar swelling uvula midline rises phonation voice is normal Eyes:     General: No scleral icterus.       Right eye: No discharge.        Left eye: No discharge.     Conjunctiva/sclera: Conjunctivae normal.     Pupils: Pupils are equal, round, and reactive to light.  Neck:     Musculoskeletal: Normal range of motion.     Vascular: No JVD.     Trachea: No tracheal  deviation.  Pulmonary:     Effort: Pulmonary effort is normal.     Breath sounds: No stridor.  Abdominal:     Comments: Minimal tenderness to the epigastric region no right upper quadrant tenderness remainder abdomen soft nontender  Neurological:     Mental Status: She is alert and oriented to person, place, and time.     Coordination: Coordination normal.  Psychiatric:        Behavior: Behavior normal.        Thought Content: Thought content normal.        Judgment: Judgment normal.      ED Treatments / Results  Labs (all labs ordered are listed, but only abnormal results are displayed) Labs Reviewed  COMPREHENSIVE METABOLIC PANEL - Abnormal; Notable for the following components:      Result Value   Creatinine, Ser 1.03 (*)    All other components within normal limits  URINALYSIS, ROUTINE W REFLEX MICROSCOPIC - Abnormal; Notable for the following components:   Color, Urine STRAW (*)    All other components within normal limits  GROUP A STREP BY PCR  LIPASE, BLOOD  CBC  I-STAT BETA HCG BLOOD, ED ([facility], [facility], [facility] ONLY)    EKG None  Radiology No results found.  Procedures Procedures (including critical care time)  Medications Ordered in ED Medications  famotidine (PEPCID) tablet 20 mg (20 mg Oral Given 06/26/19 1334)     Initial Impression / Assessment and Plan / ED Course  I have reviewed the triage vital signs and the nursing notes.  Pertinent labs & imaging results that were available during my care of the patient were reviewed by me and considered in my medical decision making (see chart for details).         Assessment/Plan: 38 year old female presents today with throat and epigastric abdominal pain high suspicion for gastritis low suspicion for any infectious etiology.  Patient will be tested for strep given her reported symptoms.  She was given Pepcid here.  Her symptoms dramatically improved with Pepcid, labs reassuring, no signs of obstructive pathology.   Discharged with return precautions.  Patient verbalized understanding and agreement to this plan had no further questions or concerns at time of discharge.      Final Clinical Impressions(s) / ED Diagnoses   Final diagnoses:  Acute gastritis  without hemorrhage, unspecified gastritis type    ED Discharge Orders         Ordered    famotidine (PEPCID) 20 MG tablet  2 times daily     06/26/19 1450           Francee Gentile 06/27/19 1228    Davonna Belling, MD 06/27/19 [phone]

## 2019-06-26 NOTE — ED Notes (Signed)
Patient Alert and oriented to baseline. Stable and ambulatory to baseline. Patient verbalized understanding of the discharge instructions.  Patient belongings were taken by the patient.   

## 2019-09-12 DIAGNOSIS — Z20828 Contact with and (suspected) exposure to other viral communicable diseases: Secondary | ICD-10-CM | POA: Diagnosis not present

## 2019-11-06 ENCOUNTER — Ambulatory Visit (INDEPENDENT_AMBULATORY_CARE_PROVIDER_SITE_OTHER): Payer: Medicaid Other

## 2019-11-06 ENCOUNTER — Encounter (HOSPITAL_COMMUNITY): Payer: Self-pay

## 2019-11-06 ENCOUNTER — Other Ambulatory Visit: Payer: Self-pay

## 2019-11-06 ENCOUNTER — Ambulatory Visit (HOSPITAL_COMMUNITY)
Admission: EM | Admit: 2019-11-06 | Discharge: 2019-11-06 | Disposition: A | Discharge status: Home / Self Care | Payer: Medicaid Other | Attending: Family Medicine | Admitting: Family Medicine

## 2019-11-06 DIAGNOSIS — S63502A Unspecified sprain of left wrist, initial encounter: Secondary | ICD-10-CM

## 2019-11-06 DIAGNOSIS — H9202 Otalgia, left ear: Secondary | ICD-10-CM | POA: Diagnosis not present

## 2019-11-06 DIAGNOSIS — M25532 Pain in left wrist: Secondary | ICD-10-CM | POA: Diagnosis not present

## 2019-11-06 MED ORDER — NEOMYCIN-POLYMYXIN-HC 3.5-10000-1 OT SUSP: 4.0000 [drp] | Freq: Three times a day (TID) | OTIC | 0 refills | Status: DC

## 2019-11-06 MED ORDER — NAPROXEN 500 MG PO TABS: 500.0000 mg | ORAL_TABLET | Freq: Two times a day (BID) | ORAL | 0 refills | Status: DC

## 2019-11-06 NOTE — ED Provider Notes (Signed)
[facility]    CSN: [id number] Arrival date & time: 11/06/19  1759      History   Chief Complaint Chief Complaint  Patient presents with  . Otalgia    HPI Shelley Higgins is a 39 y.o. female.   HPI  Patient is here with 2 medical problems Per she has had pain in her left ear for the last 2 nights.  She been popping it frequently.  No cough cold runny nose or sore throat.  No drainage from the ear.  No history of ear infections.  Hearing is normal Her second problem is left wrist pain.  She states that a heavy bag fell on it while she was reaching overhead and caused it to bend backwards.  Ever since then she has had pain and swelling around her wrist.  Past Medical History:  Diagnosis Date  . Dermoid cyst   . Pneumonia   . TB (tuberculosis)     Patient Active Problem List   Diagnosis Date Noted  . Ovarian cyst 08/13/2014    Past Surgical History:  Procedure Laterality Date  . INDUCED ABORTION      OB History    Gravida  3   Para  2   Term  2   Preterm      AB  1   Living  2     SAB      TAB  1   Ectopic      Multiple      Live Births               Home Medications    Prior to Admission medications   Medication Sig Start Date End Date Taking? Authorizing Provider  etonogestrel-ethinyl estradiol (NUVARING) 0.12-0.015 MG/24HR vaginal ring Place 1 each vaginally every 28 (twenty-eight) days. Insert vaginally and leave in place for 3 consecutive weeks, then remove for 1 week.    [provider]  famotidine (PEPCID) 20 MG tablet Take 1 tablet (20 mg total) by mouth 2 (two) times daily. 06/26/19   Hedges, Dellis Filbert, PA-C  naproxen (NAPROSYN) 500 MG tablet Take 1 tablet (500 mg total) by mouth 2 (two) times daily. 11/06/19   Raylene Everts, MD  neomycin-polymyxin-hydrocortisone (CORTISPORIN) 3.5-10000-1 OTIC suspension Place 4 drops into the left ear 3 (three) times daily. 11/06/19   Raylene Everts, MD    Family  History Family History  Problem Relation Age of Onset  . Stroke Mother   . Hypertension Mother   . Diabetes Mother     Social History Social History   Tobacco Use  . Smoking status: Current Every Day Smoker    Packs/day: 0.50    Types: Cigarettes  . Smokeless tobacco: Never Used  Substance Use Topics  . Alcohol use: Yes    Comment: socially  . Drug use: No     Allergies   Patient has no known allergies.   Review of Systems Review of Systems  HENT: Positive for ear pain. Negative for congestion, ear discharge and hearing loss.   Musculoskeletal: Positive for arthralgias.     Physical Exam Triage Vital Signs ED Triage Vitals  Enc Vitals Group     BP 11/06/19 1907 112/76     Pulse Rate 11/06/19 1907 77     Resp 11/06/19 1907 16     Temp 11/06/19 1907 98.8 F (37.1 C)     Temp Source 11/06/19 1907 Oral     SpO2 11/06/19 1907 100 %  Weight --      Height --      Head Circumference --      Peak Flow --      Pain Score 11/06/19 1905 8     Pain Loc --      Pain Edu? --      Excl. in [place]? --    No data found.  Updated Vital Signs BP 112/76 (BP Location: Right Arm)   Pulse 77   Temp 98.8 F (37.1 C) (Oral)   Resp 16   LMP 11/01/2019 (Approximate)   SpO2 100%      Physical Exam Constitutional:      General: She is not in acute distress.    Appearance: She is well-developed and normal weight.  HENT:     Head: Normocephalic and atraumatic.     Right Ear: Tympanic membrane, ear canal and external ear normal.     Left Ear: Tympanic membrane and ear canal normal.     Ears:     Comments: Mild irritation left external ear canal and with pulling of pinna    Mouth/Throat:     Comments: Mask is in place Eyes:     Conjunctiva/sclera: Conjunctivae normal.     Pupils: Pupils are equal, round, and reactive to light.  Cardiovascular:     Rate and Rhythm: Normal rate.  Pulmonary:     Effort: Pulmonary effort is normal. No respiratory distress.  Abdominal:      General: There is no distension.     Palpations: Abdomen is soft.  Musculoskeletal:        General: Normal range of motion.     Cervical back: Normal range of motion.     Comments: Left wrist has mild soft tissue swelling.  Tenderness directly over the scaphoid.  Limited range of motion.  Limited grip strength secondary to pain  Skin:    General: Skin is warm and dry.  Neurological:     Mental Status: She is alert.  Psychiatric:        Mood and Affect: Mood normal.        Behavior: Behavior normal.      UC Treatments / Results  Labs (all labs ordered are listed, but only abnormal results are displayed) Labs Reviewed - No data to display  EKG   Radiology DG Wrist Complete Left  Result Date: 11/06/2019 CLINICAL DATA:  Left scaphoid pain EXAM: LEFT WRIST - COMPLETE 3+ VIEW COMPARISON:  None. FINDINGS: There is no evidence of fracture or dislocation. There is no evidence of arthropathy or other focal bone abnormality. Soft tissues are unremarkable. IMPRESSION: Negative. Electronically Signed   By: Rolm Baptise M.D.   On: 11/06/2019 20:13    Procedures Procedures (including critical care time)  Medications Ordered in UC Medications - No data to display  Initial Impression / Assessment and Plan / UC Course  I have reviewed the triage vital signs and the nursing notes.  Pertinent labs & imaging results that were available during my care of the patient were reviewed by me and considered in my medical decision making (see chart for details).      Final Clinical Impressions(s) / UC Diagnoses   Final diagnoses:  Otalgia of left ear  Wrist sprain, left, initial encounter     Discharge Instructions     Take the naproxen 2 x a day This is for the wrist pain Take the ear drops as directed Use brace for pain Return as needed  ED Prescriptions    Medication Sig Dispense Auth. Provider   neomycin-polymyxin-hydrocortisone (CORTISPORIN) 3.5-10000-1 OTIC suspension  Place 4 drops into the left ear 3 (three) times daily. 10 mL Raylene Everts, MD   naproxen (NAPROSYN) 500 MG tablet Take 1 tablet (500 mg total) by mouth 2 (two) times daily. 30 tablet Raylene Everts, MD     PDMP not reviewed this encounter.   Raylene Everts, MD 11/06/19 2029

## 2019-11-06 NOTE — ED Triage Notes (Signed)
Patient presents to Urgent Care with complaints of painful left ear since the past two nights. Patient reports she also hurt her left hand/wrist earlier today while pulling on a heavy object.

## 2019-11-06 NOTE — Discharge Instructions (Signed)
Take the naproxen 2 x a day This is for the wrist pain Take the ear drops as directed Use brace for pain Return as needed

## 2020-03-04 ENCOUNTER — Encounter (HOSPITAL_COMMUNITY): Payer: Self-pay | Admitting: Emergency Medicine

## 2020-03-04 ENCOUNTER — Emergency Department (HOSPITAL_COMMUNITY)
Admission: EM | Admit: 2020-03-04 | Discharge: 2020-03-04 | Disposition: A | Discharge status: Home / Self Care | Payer: Medicaid Other | Attending: Emergency Medicine | Admitting: Emergency Medicine

## 2020-03-04 ENCOUNTER — Other Ambulatory Visit: Payer: Self-pay

## 2020-03-04 DIAGNOSIS — Z79899 Other long term (current) drug therapy: Secondary | ICD-10-CM | POA: Diagnosis not present

## 2020-03-04 DIAGNOSIS — F17210 Nicotine dependence, cigarettes, uncomplicated: Secondary | ICD-10-CM | POA: Insufficient documentation

## 2020-03-04 DIAGNOSIS — M436 Torticollis: Secondary | ICD-10-CM | POA: Diagnosis not present

## 2020-03-04 DIAGNOSIS — M542 Cervicalgia: Secondary | ICD-10-CM | POA: Diagnosis present

## 2020-03-04 MED ORDER — HYDROCODONE-ACETAMINOPHEN 5-325 MG PO TABS: 1.0000 | ORAL_TABLET | ORAL | 0 refills | Status: DC | PRN

## 2020-03-04 MED ORDER — DIAZEPAM 5 MG PO TABS: 5.0000 mg | ORAL_TABLET | Freq: Three times a day (TID) | ORAL | 0 refills | Status: DC | PRN

## 2020-03-04 MED ORDER — ONDANSETRON HCL 4 MG/2ML IJ SOLN
4.0000 mg | Freq: Once | INTRAMUSCULAR | Status: AC
  Administered 2020-03-04: 4 mg via INTRAVENOUS
  Filled 2020-03-04: qty 2

## 2020-03-04 MED ORDER — KETOROLAC TROMETHAMINE 30 MG/ML IJ SOLN
30.0000 mg | Freq: Once | INTRAMUSCULAR | Status: AC
  Administered 2020-03-04: 30 mg via INTRAVENOUS
  Filled 2020-03-04: qty 1

## 2020-03-04 MED ORDER — MORPHINE SULFATE (PF) 4 MG/ML IV SOLN
4.0000 mg | Freq: Once | INTRAVENOUS | Status: AC
  Administered 2020-03-04: 4 mg via INTRAVENOUS
  Filled 2020-03-04: qty 1

## 2020-03-04 NOTE — ED Provider Notes (Signed)
[facility] EMERGENCY DEPARTMENT Provider Note   CSN: [id number] Arrival date & time: 03/04/20  0343     History Chief Complaint  Patient presents with  . Neck Pain  . Back Pain    Shelley Higgins is a 39 y.o. female.  Patient presents to the emergency department for evaluation of neck pain.  Patient reports that she started feeling pain on the left side of her neck yesterday around 6 PM.  Since then the pain has progressively worsened.  Pain is radiating into the left upper back.  Patient reports that she cannot turn her head from side to side because of the pain.  She denies any injury.  Pain does not radiate to arms.  No numbness, tingling or weakness in extremities.        Past Medical History:  Diagnosis Date  . Dermoid cyst   . Pneumonia   . TB (tuberculosis)     Patient Active Problem List   Diagnosis Date Noted  . Ovarian cyst 08/13/2014    Past Surgical History:  Procedure Laterality Date  . INDUCED ABORTION       OB History    Gravida  3   Para  2   Term  2   Preterm      AB  1   Living  2     SAB      TAB  1   Ectopic      Multiple      Live Births              Family History  Problem Relation Age of Onset  . Stroke Mother   . Hypertension Mother   . Diabetes Mother     Social History   Tobacco Use  . Smoking status: Current Every Day Smoker    Packs/day: 0.50    Types: Cigarettes  . Smokeless tobacco: Never Used  Substance Use Topics  . Alcohol use: Yes    Comment: socially  . Drug use: No    Home Medications Prior to Admission medications   Medication Sig Start Date End Date Taking? Authorizing Provider  famotidine (PEPCID) 20 MG tablet Take 1 tablet (20 mg total) by mouth 2 (two) times daily. 06/26/19  Yes Hedges, Dellis Filbert, PA-C  ibuprofen (ADVIL) 200 MG tablet Take 200 mg by mouth daily as needed for moderate pain.   Yes [provider]  naproxen (NAPROSYN) 500 MG tablet Take 1 tablet  (500 mg total) by mouth 2 (two) times daily. 11/06/19  Yes Raylene Everts, MD  diazepam (VALIUM) 5 MG tablet Take 1 tablet (5 mg total) by mouth every 8 (eight) hours as needed for muscle spasms. 03/04/20   Orpah Greek, MD  HYDROcodone-acetaminophen (NORCO/VICODIN) 5-325 MG tablet Take 1 tablet by mouth every 4 (four) hours as needed for moderate pain. 03/04/20   Delise Simenson, Gwenyth Allegra, MD  neomycin-polymyxin-hydrocortisone (CORTISPORIN) 3.5-10000-1 OTIC suspension Place 4 drops into the left ear 3 (three) times daily. Patient not taking: Reported on 03/04/2020 11/06/19   Raylene Everts, MD    Allergies    Patient has no known allergies.  Review of Systems   Review of Systems  Musculoskeletal: Positive for neck pain.  All other systems reviewed and are negative.   Physical Exam Updated Vital Signs BP 107/80   Pulse 86   Temp 99 F (37.2 C) (Oral)   Resp 18   Ht 5' 4.5" (1.638 m)   Wt 92.1 kg  SpO2 100%   BMI 34.31 kg/m   Physical Exam Vitals and nursing note reviewed.  Constitutional:      General: She is not in acute distress.    Appearance: Normal appearance. She is well-developed.  HENT:     Head: Normocephalic and atraumatic.     Right Ear: Hearing normal.     Left Ear: Hearing normal.     Nose: Nose normal.  Eyes:     Conjunctiva/sclera: Conjunctivae normal.     Pupils: Pupils are equal, round, and reactive to light.  Cardiovascular:     Rate and Rhythm: Regular rhythm.     Heart sounds: S1 normal and S2 normal. No murmur. No friction rub. No gallop.   Pulmonary:     Effort: Pulmonary effort is normal. No respiratory distress.     Breath sounds: Normal breath sounds.  Chest:     Chest wall: No tenderness.  Abdominal:     General: Bowel sounds are normal.     Palpations: Abdomen is soft.     Tenderness: There is no abdominal tenderness. There is no guarding or rebound. Negative signs include Murphy's sign and McBurney's sign.     Hernia: No hernia  is present.  Musculoskeletal:     Cervical back: Spasms, torticollis and tenderness present. Pain with movement and muscular tenderness present. Decreased range of motion.       Back:  Skin:    General: Skin is warm and dry.     Findings: No rash.  Neurological:     Mental Status: She is alert and oriented to person, place, and time.     GCS: GCS eye subscore is 4. GCS verbal subscore is 5. GCS motor subscore is 6.     Cranial Nerves: No cranial nerve deficit.     Sensory: No sensory deficit.     Coordination: Coordination normal.  Psychiatric:        Speech: Speech normal.        Behavior: Behavior normal.        Thought Content: Thought content normal.     ED Results / Procedures / Treatments   Labs (all labs ordered are listed, but only abnormal results are displayed) Labs Reviewed - No data to display  EKG None  Radiology No results found.  Procedures Procedures (including critical care time)  Medications Ordered in ED Medications  ketorolac (TORADOL) 30 MG/ML injection 30 mg (30 mg Intravenous Given 03/04/20 0607)  morphine 4 MG/ML injection 4 mg (4 mg Intravenous Given 03/04/20 0607)  ondansetron (ZOFRAN) injection 4 mg (4 mg Intravenous Given 03/04/20 0355)    ED Course  I have reviewed the triage vital signs and the nursing notes.  Pertinent labs & imaging results that were available during my care of the patient were reviewed by me and considered in my medical decision making (see chart for details).    MDM Rules/Calculators/A&P                      Presentation is consistent with acute muscle spasm/torticollis.  Patient has no radicular pain.  She has normal strength and sensation upper extremities.  There has not been any injury.  Patient has soft tissue/paraspinal tenderness and spasm on exam.  She does not require imaging.  Patient administered a dose of Toradol and morphine and has had significant improvement.  Will discharge with analgesia and Valium for  muscle relaxant.  Final Clinical Impression(s) / ED Diagnoses Final diagnoses:  Torticollis, acute    Rx / DC Orders ED Discharge Orders         Ordered    HYDROcodone-acetaminophen (NORCO/VICODIN) 5-325 MG tablet  Every 4 hours PRN     03/04/20 0657    diazepam (VALIUM) 5 MG tablet  Every 8 hours PRN     03/04/20 0657           Orpah Greek, MD 03/04/20 [phone]

## 2020-03-04 NOTE — Discharge Instructions (Addendum)
Take the pain medicine as needed for your neck pain.  You can take the Valium as needed for spasm.  Do not take them together as they may cause significant sedation.  You can take ibuprofen with these medications as needed as well.

## 2020-03-04 NOTE — ED Notes (Signed)
Discharge instructions including pain management and prescription discussed with pt. Pt verbalized understanding with no questions at this time. Pt to go home with significant other.

## 2020-03-04 NOTE — ED Triage Notes (Signed)
Pt reports right sided neck pain that goes into her back.  It started last night around 6pm and continued to get worse despite trying OTC medications, Ice/heat and rest.  Pt describes it as a "charlie horse in my neck. "

## 2020-06-24 DIAGNOSIS — Z20822 Contact with and (suspected) exposure to covid-19: Secondary | ICD-10-CM | POA: Diagnosis not present

## 2020-09-09 ENCOUNTER — Other Ambulatory Visit: Payer: Self-pay

## 2020-09-09 ENCOUNTER — Encounter: Payer: Self-pay | Admitting: Family Medicine

## 2020-09-09 ENCOUNTER — Ambulatory Visit (INDEPENDENT_AMBULATORY_CARE_PROVIDER_SITE_OTHER): Payer: Medicaid Other | Admitting: Family Medicine

## 2020-09-09 VITALS — BP 124/86 | HR 82 | Temp 98.2°F | Ht 64.5 in | Wt 201.0 lb

## 2020-09-09 DIAGNOSIS — Z6833 Body mass index (BMI) 33.0-33.9, adult: Secondary | ICD-10-CM

## 2020-09-09 DIAGNOSIS — Z131 Encounter for screening for diabetes mellitus: Secondary | ICD-10-CM | POA: Diagnosis not present

## 2020-09-09 DIAGNOSIS — Z13220 Encounter for screening for lipoid disorders: Secondary | ICD-10-CM | POA: Diagnosis not present

## 2020-09-09 DIAGNOSIS — Z1159 Encounter for screening for other viral diseases: Secondary | ICD-10-CM

## 2020-09-09 DIAGNOSIS — M546 Pain in thoracic spine: Secondary | ICD-10-CM | POA: Diagnosis not present

## 2020-09-09 DIAGNOSIS — Z1329 Encounter for screening for other suspected endocrine disorder: Secondary | ICD-10-CM | POA: Diagnosis not present

## 2020-09-09 DIAGNOSIS — Z23 Encounter for immunization: Secondary | ICD-10-CM

## 2020-09-09 DIAGNOSIS — R0683 Snoring: Secondary | ICD-10-CM | POA: Diagnosis not present

## 2020-09-09 DIAGNOSIS — K219 Gastro-esophageal reflux disease without esophagitis: Secondary | ICD-10-CM | POA: Insufficient documentation

## 2020-09-09 DIAGNOSIS — Z130 Encounter for screening for diseases of the blood and blood-forming organs and certain disorders involving the immune mechanism: Secondary | ICD-10-CM | POA: Diagnosis not present

## 2020-09-09 DIAGNOSIS — Z1321 Encounter for screening for nutritional disorder: Secondary | ICD-10-CM

## 2020-09-09 DIAGNOSIS — R350 Frequency of micturition: Secondary | ICD-10-CM

## 2020-09-09 LAB — POCT URINALYSIS DIP (MANUAL ENTRY)
Bilirubin, UA: NEGATIVE
Blood, UA: NEGATIVE
Glucose, UA: NEGATIVE mg/dL
Ketones, POC UA: NEGATIVE mg/dL
Nitrite, UA: NEGATIVE
Protein Ur, POC: NEGATIVE mg/dL
Spec Grav, UA: 1.025 (ref 1.010–1.025)
Urobilinogen, UA: 0.2 E.U./dL
pH, UA: 6 (ref 5.0–8.0)

## 2020-09-09 MED ORDER — DICLOFENAC SODIUM 1 % EX GEL: 2.0000 g | Freq: Four times a day (QID) | CUTANEOUS | 3 refills | Status: AC

## 2020-09-09 MED ORDER — FAMOTIDINE 20 MG PO TABS: 20.0000 mg | ORAL_TABLET | Freq: Every day | ORAL | 6 refills | Status: DC

## 2020-09-09 NOTE — Progress Notes (Signed)
12/13/202111:24 AM  Shelley Higgins October 16, 1980, 39 y.o., female [id number]  Chief Complaint  Patient presents with  . low/ back pain and frequent urination    For about 2 weeks now  . New Patient (Initial Visit)  . Gastroesophageal Reflux  . Snoring    HPI:   Patient is a 39 y.o. female with a significant past medical history of GERD who presents today for new patient initial visit.  Quit smoking 06/04/20  Has been having lower back pain Worse upon standing Frequent urination  Has been working on weight loss Wt Readings from Last 3 Encounters:  09/09/20 201 lb (91.2 kg)  03/04/20 203 lb (92.1 kg)  08/30/18 195 lb (88.5 kg)   Would like weight loss surgery  Frequent issues with loud snoring The snoring wakes her up at night Frequent issues with heartburn: takes famotidine as needed  Recent ED visits 03/04/20: neck pain (muscle spasm/torticollis) valium and norco/vicodin 11/06/19: Ear infection externa, wrist pain 06/26/19: Gastritis given pepcid 11/20/18: right hand finger sprain  Screenings Pap: 08/13/14 (due) Tetanus: today Flu: Declines Covid: Declines Dentist: needs to go (has a partial) Eye: needs to go (last 2 years ago)   Depression screen [facility] 2/9 09/09/2020  Decreased Interest 0  Down, Depressed, Hopeless 0  PHQ - 2 Score 0    Fall Risk  09/09/2020  Falls in the past year? 0  Number falls in past yr: 0  Injury with Fall? 0  Follow up Falls evaluation completed     No Known Allergies  Prior to Admission medications   Medication Sig Start Date End Date Taking? Authorizing Provider  diazepam (VALIUM) 5 MG tablet Take 1 tablet (5 mg total) by mouth every 8 (eight) hours as needed for muscle spasms. 03/04/20   Orpah Greek, MD  famotidine (PEPCID) 20 MG tablet Take 1 tablet (20 mg total) by mouth 2 (two) times daily. 06/26/19   Hedges, Dellis Filbert, PA-C  HYDROcodone-acetaminophen (NORCO/VICODIN) 5-325 MG tablet Take 1 tablet by mouth every 4  (four) hours as needed for moderate pain. 03/04/20   Orpah Greek, MD  ibuprofen (ADVIL) 200 MG tablet Take 200 mg by mouth daily as needed for moderate pain.    [provider]  naproxen (NAPROSYN) 500 MG tablet Take 1 tablet (500 mg total) by mouth 2 (two) times daily. 11/06/19   Raylene Everts, MD  neomycin-polymyxin-hydrocortisone (CORTISPORIN) 3.5-10000-1 OTIC suspension Place 4 drops into the left ear 3 (three) times daily. Patient not taking: Reported on 03/04/2020 11/06/19   Raylene Everts, MD    Past Medical History:  Diagnosis Date  . Anxiety    Phreesia 09/08/2020  . Dermoid cyst   . GERD (gastroesophageal reflux disease)    Phreesia 09/08/2020  . Pneumonia   . TB (tuberculosis)   . Tuberculosis    Phreesia 09/08/2020    Past Surgical History:  Procedure Laterality Date  . INDUCED ABORTION      Social History   Tobacco Use  . Smoking status: Former Smoker    Years: 20.00    Types: Cigarettes    Quit date: 06/04/2020    Years since quitting: 0.2  . Smokeless tobacco: Never Used  Substance Use Topics  . Alcohol use: Yes    Comment: socially    Family History  Problem Relation Age of Onset  . Stroke Mother   . Hypertension Mother   . Diabetes Mother     Review of Systems  Constitutional: Negative for  chills, fever and malaise/fatigue.  Eyes: Negative for blurred vision and double vision.  Respiratory: Negative for cough, shortness of breath and wheezing.   Cardiovascular: Negative for chest pain, palpitations and leg swelling.  Gastrointestinal: Positive for heartburn. Negative for abdominal pain, blood in stool, constipation, diarrhea, nausea and vomiting.  Genitourinary: Positive for flank pain and frequency. Negative for dysuria, hematuria and urgency.  Musculoskeletal: Negative for back pain and joint pain.  Skin: Negative for rash.  Neurological: Negative for dizziness, weakness and headaches.     OBJECTIVE:  Today's Vitals    09/09/20 1016  BP: 124/86  Pulse: 82  Temp: 98.2 F (36.8 C)  SpO2: 98%  Weight: 201 lb (91.2 kg)  Height: 5' 4.5" (1.638 m)   Body mass index is 33.97 kg/m.   Physical Exam Constitutional:      General: She is not in acute distress.    Appearance: Normal appearance. She is not ill-appearing.  HENT:     Head: Normocephalic.  Cardiovascular:     Rate and Rhythm: Normal rate and regular rhythm.     Pulses: Normal pulses.     Heart sounds: Normal heart sounds. No murmur heard. No friction rub. No gallop.   Pulmonary:     Effort: Pulmonary effort is normal. No respiratory distress.     Breath sounds: Normal breath sounds. No stridor. No wheezing, rhonchi or rales.  Abdominal:     General: Bowel sounds are normal.     Palpations: Abdomen is soft.     Tenderness: There is no abdominal tenderness.  Musculoskeletal:     Right lower leg: No edema.     Left lower leg: No edema.  Skin:    General: Skin is warm and dry.  Neurological:     Mental Status: She is alert and oriented to person, place, and time.  Psychiatric:        Mood and Affect: Mood normal.        Behavior: Behavior normal.     Results for orders placed or performed in visit on 09/09/20 (from the past 24 hour(s))  POCT urinalysis dipstick     Status: Abnormal   Collection Time: 09/09/20 10:56 AM  Result Value Ref Range   Color, UA yellow yellow   Clarity, UA clear clear   Glucose, UA negative negative mg/dL   Bilirubin, UA negative negative   Ketones, POC UA negative negative mg/dL   Spec Grav, UA 1.025 1.010 - 1.025   Blood, UA negative negative   pH, UA 6.0 5.0 - 8.0   Protein Ur, POC negative negative mg/dL   Urobilinogen, UA 0.2 0.2 or 1.0 E.U./dL   Nitrite, UA Negative Negative   Leukocytes, UA Small (1+) (A) Negative    No results found.   ASSESSMENT and PLAN  Problem List Items Addressed This Visit   None   Visit Diagnoses    Urinary frequency    -  Primary   Relevant Orders   POCT  urinalysis dipstick (Completed) (positive for small leukocytes)   Urine Culture Will follow up with lab results   Screening, anemia, deficiency, iron       Relevant Orders   CBC   Screening for diabetes mellitus       Relevant Orders   CMP14+EGFR   Hemoglobin A1c   Screening for thyroid disorder       Relevant Orders   TSH   Encounter for vitamin deficiency screening       Relevant Orders  Vitamin D, 25-hydroxy   Screening, lipid       Relevant Orders   Lipid Panel   Encounter for hepatitis C screening test for low risk patient       Relevant Orders   Hepatitis C antibody   BMI 33.0-33.9,adult       Gastroesophageal reflux disease without esophagitis       Relevant Medications   famotidine (PEPCID) 20 MG tablet Take daily Discussed food choices in GERD   Acute bilateral thoracic back pain       Relevant Medications   diclofenac Sodium (VOLTAREN) 1 % GEL Does not take ibuprofen due to GERD Take Tylenol for pain and use voltaren gel as needed   Snoring       Relevant Orders   Ambulatory referral to Sleep Studies   Encounter for vaccination       Relevant Orders   Tdap vaccine greater than or equal to 7yo IM (Completed)     Recommended to schedule pap  Return in about 3 months (around 12/08/2020).   [name] Kristain Hu, FNP-BC Primary Care at [address] [place], [place] [address] Ph.  [phone] Fax [phone]

## 2020-09-09 NOTE — Patient Instructions (Addendum)
Schedule pap at earliest convenience Take famotidine daily for reflux They will call you about scheduling a sleep study I will be in contact with you to discuss lab results For back pain use voltaren gel as needed and take Tylenol for pain  Health Maintenance, Female Adopting a healthy lifestyle and getting preventive care are important in promoting health and wellness. Ask your health care provider about:  The right schedule for you to have regular tests and exams.  Things you can do on your own to prevent diseases and keep yourself healthy. What should I know about diet, weight, and exercise? Eat a healthy diet   Eat a diet that includes plenty of vegetables, fruits, low-fat dairy products, and lean protein.  Do not eat a lot of foods that are high in solid fats, added sugars, or sodium. Maintain a healthy weight Body mass index (BMI) is used to identify weight problems. It estimates body fat based on height and weight. Your health care provider can help determine your BMI and help you achieve or maintain a healthy weight. Get regular exercise Get regular exercise. This is one of the most important things you can do for your health. Most adults should:  Exercise for at least 150 minutes each week. The exercise should increase your heart rate and make you sweat (moderate-intensity exercise).  Do strengthening exercises at least twice a week. This is in addition to the moderate-intensity exercise.  Spend less time sitting. Even light physical activity can be beneficial. Watch cholesterol and blood lipids Have your blood tested for lipids and cholesterol at 38 years of age, then have this test every 5 years. Have your cholesterol levels checked more often if:  Your lipid or cholesterol levels are high.  You are older than 38 years of age.  You are at high risk for heart disease. What should I know about cancer screening? Depending on your health history and family history, you  may need to have cancer screening at various ages. This may include screening for:  Breast cancer.  Cervical cancer.  Colorectal cancer.  Skin cancer.  Lung cancer. What should I know about heart disease, diabetes, and high blood pressure? Blood pressure and heart disease  High blood pressure causes heart disease and increases the risk of stroke. This is more likely to develop in people who have high blood pressure readings, are of African descent, or are overweight.  Have your blood pressure checked: ? Every 3-5 years if you are 52-89 years of age. ? Every year if you are 76 years old or older. Diabetes Have regular diabetes screenings. This checks your fasting blood sugar level. Have the screening done:  Once every three years after age 29 if you are at a normal weight and have a low risk for diabetes.  More often and at a younger age if you are overweight or have a high risk for diabetes. What should I know about preventing infection? Hepatitis B If you have a higher risk for hepatitis B, you should be screened for this virus. Talk with your health care provider to find out if you are at risk for hepatitis B infection. Hepatitis C Testing is recommended for:  Everyone born from [birth year] through 1964.  Anyone with known risk factors for hepatitis C. Sexually transmitted infections (STIs)  Get screened for STIs, including gonorrhea and chlamydia, if: ? You are sexually active and are younger than 38 years of age. ? You are older than 38 years of age  and your health care provider tells you that you are at risk for this type of infection. ? Your sexual activity has changed since you were last screened, and you are at increased risk for chlamydia or gonorrhea. Ask your health care provider if you are at risk.  Ask your health care provider about whether you are at high risk for HIV. Your health care provider may recommend a prescription medicine to help prevent HIV infection. If  you choose to take medicine to prevent HIV, you should first get tested for HIV. You should then be tested every 3 months for as long as you are taking the medicine. Pregnancy  If you are about to stop having your period (premenopausal) and you may become pregnant, seek counseling before you get pregnant.  Take 400 to 800 micrograms (mcg) of folic acid every day if you become pregnant.  Ask for birth control (contraception) if you want to prevent pregnancy. Osteoporosis and menopause Osteoporosis is a disease in which the bones lose minerals and strength with aging. This can result in bone fractures. If you are 34 years old or older, or if you are at risk for osteoporosis and fractures, ask your health care provider if you should:  Be screened for bone loss.  Take a calcium or vitamin D supplement to lower your risk of fractures.  Be given hormone replacement therapy (HRT) to treat symptoms of menopause. Follow these instructions at home: Lifestyle  Do not use any products that contain nicotine or tobacco, such as cigarettes, e-cigarettes, and chewing tobacco. If you need help quitting, ask your health care provider.  Do not use street drugs.  Do not share needles.  Ask your health care provider for help if you need support or information about quitting drugs. Alcohol use  Do not drink alcohol if: ? Your health care provider tells you not to drink. ? You are pregnant, may be pregnant, or are planning to become pregnant.  If you drink alcohol: ? Limit how much you use to 0-1 drink a day. ? Limit intake if you are breastfeeding.  Be aware of how much alcohol is in your drink. In the [place] one drink equals one 12 oz bottle of beer (355 mL), one 5 oz glass of wine (148 mL), or one 1 oz glass of hard liquor (44 mL). General instructions  Schedule regular health, dental, and eye exams.  Stay current with your vaccines.  Tell your health care provider if: ? You often feel  depressed. ? You have ever been abused or do not feel safe at home. Summary  Adopting a healthy lifestyle and getting preventive care are important in promoting health and wellness.  Follow your health care provider's instructions about healthy diet, exercising, and getting tested or screened for diseases.  Follow your health care provider's instructions on monitoring your cholesterol and blood pressure. This information is not intended to replace advice given to you by your health care provider. Make sure you discuss any questions you have with your health care provider. Document Revised: 09/07/2018 Document Reviewed: 09/07/2018 Elsevier Patient Education  2020 [employer].

## 2020-09-10 ENCOUNTER — Encounter: Payer: Self-pay | Admitting: Family Medicine

## 2020-09-10 ENCOUNTER — Other Ambulatory Visit: Payer: Self-pay | Admitting: Family Medicine

## 2020-09-10 DIAGNOSIS — R7303 Prediabetes: Secondary | ICD-10-CM | POA: Insufficient documentation

## 2020-09-10 DIAGNOSIS — E559 Vitamin D deficiency, unspecified: Secondary | ICD-10-CM

## 2020-09-10 DIAGNOSIS — E785 Hyperlipidemia, unspecified: Secondary | ICD-10-CM | POA: Insufficient documentation

## 2020-09-10 DIAGNOSIS — E7849 Other hyperlipidemia: Secondary | ICD-10-CM

## 2020-09-10 LAB — CMP14+EGFR
ALT: 19 IU/L (ref 0–32)
AST: 23 IU/L (ref 0–40)
Albumin/Globulin Ratio: 1.7 (ref 1.2–2.2)
Albumin: 4.7 g/dL (ref 3.8–4.8)
Alkaline Phosphatase: 59 IU/L (ref 44–121)
BUN/Creatinine Ratio: 9 (ref 9–23)
BUN: 9 mg/dL (ref 6–20)
Bilirubin Total: 0.4 mg/dL (ref 0.0–1.2)
CO2: 25 mmol/L (ref 20–29)
Calcium: 9.7 mg/dL (ref 8.7–10.2)
Chloride: 99 mmol/L (ref 96–106)
Creatinine, Ser: 1.04 mg/dL — ABNORMAL HIGH (ref 0.57–1.00)
GFR calc Af Amer: 79 mL/min/{1.73_m2} (ref >59)
GFR calc non Af Amer: 68 mL/min/{1.73_m2} (ref >59)
Globulin, Total: 2.8 g/dL (ref 1.5–4.5)
Glucose: 89 mg/dL (ref 65–99)
Potassium: 4.3 mmol/L (ref 3.5–5.2)
Sodium: 137 mmol/L (ref 134–144)
Total Protein: 7.5 g/dL (ref 6.0–8.5)

## 2020-09-10 LAB — LIPID PANEL
Chol/HDL Ratio: 4 ratio (ref 0.0–4.4)
Cholesterol, Total: 224 mg/dL — ABNORMAL HIGH (ref 100–199)
HDL: 56 mg/dL (ref >39)
LDL Chol Calc (NIH): 151 mg/dL — ABNORMAL HIGH (ref 0–99)
Triglycerides: 98 mg/dL (ref 0–149)
VLDL Cholesterol Cal: 17 mg/dL (ref 5–40)

## 2020-09-10 LAB — CBC
Hematocrit: 39.6 % (ref 34.0–46.6)
Hemoglobin: 13.1 g/dL (ref 11.1–15.9)
MCH: 27.3 pg (ref 26.6–33.0)
MCHC: 33.1 g/dL (ref 31.5–35.7)
MCV: 83 fL (ref 79–97)
Platelets: 330 10*3/uL (ref 150–450)
RBC: 4.79 x10E6/uL (ref 3.77–5.28)
RDW: 12.2 % (ref 11.7–15.4)
WBC: 6.3 10*3/uL (ref 3.4–10.8)

## 2020-09-10 LAB — TSH: TSH: 1.16 u[IU]/mL (ref 0.450–4.500)

## 2020-09-10 LAB — HEMOGLOBIN A1C
Est. average glucose Bld gHb Est-mCnc: 117 mg/dL
Hgb A1c MFr Bld: 5.7 % — ABNORMAL HIGH (ref 4.8–5.6)

## 2020-09-10 LAB — HEPATITIS C ANTIBODY: Hep C Virus Ab: <0.1 s/co ratio (ref 0.0–0.9)

## 2020-09-10 LAB — VITAMIN D 25 HYDROXY (VIT D DEFICIENCY, FRACTURES): Vit D, 25-Hydroxy: 12 ng/mL — ABNORMAL LOW (ref 30.0–100.0)

## 2020-09-10 MED ORDER — ROSUVASTATIN CALCIUM 10 MG PO TABS: 10.0000 mg | ORAL_TABLET | Freq: Every day | ORAL | 3 refills | Status: DC

## 2020-09-10 MED ORDER — VITAMIN D (ERGOCALCIFEROL) 1.25 MG (50000 UNIT) PO CAPS: 50000.0000 [IU] | ORAL_CAPSULE | ORAL | 6 refills | Status: DC

## 2020-09-11 LAB — URINE CULTURE

## 2020-10-07 ENCOUNTER — Ambulatory Visit (INDEPENDENT_AMBULATORY_CARE_PROVIDER_SITE_OTHER): Payer: Medicaid Other | Admitting: Family Medicine

## 2020-10-07 ENCOUNTER — Other Ambulatory Visit (HOSPITAL_COMMUNITY)
Admission: RE | Admit: 2020-10-07 | Discharge: 2020-10-07 | Disposition: A | Discharge status: Home / Self Care | Payer: Medicaid Other | Source: Ambulatory Visit | Attending: Family Medicine | Admitting: Family Medicine

## 2020-10-07 ENCOUNTER — Encounter: Payer: Self-pay | Admitting: Family Medicine

## 2020-10-07 ENCOUNTER — Other Ambulatory Visit: Payer: Self-pay

## 2020-10-07 VITALS — BP 104/71 | HR 90 | Temp 98.1°F | Ht 64.5 in | Wt 202.0 lb

## 2020-10-07 DIAGNOSIS — Z01419 Encounter for gynecological examination (general) (routine) without abnormal findings: Secondary | ICD-10-CM

## 2020-10-07 DIAGNOSIS — K219 Gastro-esophageal reflux disease without esophagitis: Secondary | ICD-10-CM | POA: Diagnosis not present

## 2020-10-07 DIAGNOSIS — E559 Vitamin D deficiency, unspecified: Secondary | ICD-10-CM

## 2020-10-07 DIAGNOSIS — Z01411 Encounter for gynecological examination (general) (routine) with abnormal findings: Secondary | ICD-10-CM | POA: Diagnosis not present

## 2020-10-07 DIAGNOSIS — R7303 Prediabetes: Secondary | ICD-10-CM

## 2020-10-07 DIAGNOSIS — E7849 Other hyperlipidemia: Secondary | ICD-10-CM | POA: Diagnosis not present

## 2020-10-07 LAB — POCT WET + KOH PREP
Trich by wet prep: ABSENT
Yeast by KOH: ABSENT
Yeast by wet prep: ABSENT

## 2020-10-07 MED ORDER — FAMOTIDINE 20 MG PO TABS: 20.0000 mg | ORAL_TABLET | Freq: Every day | ORAL | 3 refills | Status: DC

## 2020-10-07 NOTE — Progress Notes (Signed)
1/10/202211:56 AM  [name] [birth year], 40 y.o., female [id number]  Chief Complaint  Patient presents with  . Gynecologic Exam    With STI testing     HPI:   Patient is a 40 y.o. female with past medical history significant for HLD, vitamin d deficiency, who presents today for pap.  Last pap 2015 Having regular periods, denies issues LMP: End of Dec Denies using birth control Same partner 8 years Using condoms, partner never had kids and sperm count Is low  Denies urinary symptoms, discharge  Started medications for cholesterol And vitamin d deficiency Lab Results  Component Value Date   CHOL 224 (H) 09/09/2020   HDL 56 09/09/2020   LDLCALC 151 (H) 09/09/2020   TRIG 98 09/09/2020   CHOLHDL 4.0 09/09/2020    Wt Readings from Last 3 Encounters:  10/07/20 202 lb (91.6 kg)  09/09/20 201 lb (91.2 kg)  03/04/20 203 lb (92.1 kg)      Depression screen PHQ 2/9 09/09/2020  Decreased Interest 0  Down, Depressed, Hopeless 0  PHQ - 2 Score 0    Fall Risk  09/09/2020  Falls in the past year? 0  Number falls in past yr: 0  Injury with Fall? 0  Follow up Falls evaluation completed     No Known Allergies  Prior to Admission medications   Medication Sig Start Date End Date Taking? Authorizing Provider  diclofenac Sodium (VOLTAREN) 1 % GEL Apply topically 4 (four) times daily.   Yes [provider]  famotidine (PEPCID) 20 MG tablet Take 1 tablet (20 mg total) by mouth daily. 09/09/20  Yes Courtni Balash, Laurita Quint, FNP  rosuvastatin (CRESTOR) 10 MG tablet Take 1 tablet (10 mg total) by mouth daily. 09/10/20  Yes Yannis Broce, Laurita Quint, FNP  Vitamin D, Ergocalciferol, (DRISDOL) 1.25 MG (50000 UNIT) CAPS capsule Take 1 capsule (50,000 Units total) by mouth every 7 (seven) days. 09/10/20  Yes Izaias Krupka, Laurita Quint, FNP    Past Medical History:  Diagnosis Date  . Anxiety    Phreesia 09/08/2020  . Dermoid cyst   . GERD (gastroesophageal reflux disease)    Phreesia 09/08/2020   . Pneumonia   . TB (tuberculosis)   . Tuberculosis    Phreesia 09/08/2020    Past Surgical History:  Procedure Laterality Date  . INDUCED ABORTION      Social History   Tobacco Use  . Smoking status: Former Smoker    Years: 20.00    Types: Cigarettes    Quit date: 06/04/2020    Years since quitting: 0.3  . Smokeless tobacco: Never Used  Substance Use Topics  . Alcohol use: Yes    Comment: socially    Family History  Problem Relation Age of Onset  . Stroke Mother   . Hypertension Mother   . Diabetes Mother     Review of Systems  Constitutional: Negative for chills, fever and malaise/fatigue.  Eyes: Negative for blurred vision and double vision.  Respiratory: Negative for cough, shortness of breath and wheezing.   Cardiovascular: Negative for chest pain, palpitations and leg swelling.  Gastrointestinal: Negative for abdominal pain, blood in stool, constipation, diarrhea, heartburn, nausea and vomiting.  Genitourinary: Negative for dysuria, frequency, hematuria and urgency.  Musculoskeletal: Negative for back pain and joint pain.  Skin: Negative for rash.  Neurological: Negative for dizziness, weakness and headaches.     OBJECTIVE:  Today's Vitals   10/07/20 1112  BP: 104/71  Pulse: 90  Temp: 98.1 F (36.7 C)  SpO2: 98%  Weight: 202 lb (91.6 kg)  Height: 5' 4.5" (1.638 m)   Body mass index is 34.14 kg/m.   Physical Exam Constitutional:      General: She is not in acute distress.    Appearance: Normal appearance. She is not ill-appearing.  HENT:     Head: Normocephalic.  Cardiovascular:     Rate and Rhythm: Normal rate and regular rhythm.     Pulses: Normal pulses.     Heart sounds: Normal heart sounds. No murmur heard. No friction rub. No gallop.   Pulmonary:     Effort: Pulmonary effort is normal. No respiratory distress.     Breath sounds: Normal breath sounds. No stridor. No wheezing, rhonchi or rales.  Abdominal:     General: Bowel sounds  are normal.     Palpations: Abdomen is soft.     Tenderness: There is no abdominal tenderness.  Musculoskeletal:     Right lower leg: No edema.     Left lower leg: No edema.  Skin:    General: Skin is warm and dry.  Neurological:     Mental Status: She is alert and oriented to person, place, and time.  Psychiatric:        Mood and Affect: Mood normal.        Behavior: Behavior normal.   Pelvic exam: normal external genitalia, vulva, vagina, cervix, uterus and adnexa, VULVA: normal appearing vulva with no masses, tenderness or lesions, VAGINA: normal appearing vagina with normal color and discharge, no lesions, CERVIX: normal appearing cervix without discharge or lesions, cervical discharge present - white, WET MOUNT done - results: KOH done, DNA probe for chlamydia and GC obtained, DNA probe for chlamydia and GC obtained, UTERUS: uterus is normal size, shape, consistency and nontender, ADNEXA: normal adnexa in size, nontender and no masses, no palpable internal organs, PAP: Pap smear done today, thin-prep method, DNA probe for chlamydia and GC obtained, HPV test, WET MOUNT done - results: KOH done, DNA probe for chlamydia and GC obtained, exam chaperoned by Lennie Odor, LPN.   Results for orders placed or performed in visit on 10/07/20 (from the past 24 hour(s))  POCT Wet + KOH Prep     Status: Abnormal   Collection Time: 10/07/20 11:55 AM  Result Value Ref Range   Yeast by KOH Absent Absent   Yeast by wet prep Absent Absent   WBC by wet prep None (A) Few   Clue Cells Wet Prep HPF POC None None   Trich by wet prep Absent Absent   Bacteria Wet Prep HPF POC Few Few   Epithelial Cells By [employer] Pref ([facility]) Few None, Few, Too numerous to count   RBC,UR,HPF,POC None None RBC/hpf    No results found.   ASSESSMENT and PLAN  Problem List Items Addressed This Visit      Digestive   GERD (gastroesophageal reflux disease)   Relevant Medications   famotidine (PEPCID) 20 MG tablet     Other    Vitamin D deficiency   Hyperlipidemia   Pre-diabetes    Other Visit Diagnoses    Encounter for cervical Pap smear with pelvic exam    -  Primary   Relevant Orders   Cytology - PAP([place])   POCT Wet + KOH Prep (Completed)      Return for next scheduled visit.    [name] Kaesha Kirsch, FNP-BC Primary Care at [address] [place], [place] [address] Ph.  [phone] Fax [phone]

## 2020-10-07 NOTE — Patient Instructions (Signed)

## 2020-10-14 LAB — CYTOLOGY - PAP
Chlamydia: NEGATIVE
Comment: NEGATIVE
Comment: NEGATIVE
Comment: NEGATIVE
Comment: NEGATIVE
Comment: NORMAL
Diagnosis: NEGATIVE
HSV1: NEGATIVE
HSV2: NEGATIVE
High risk HPV: NEGATIVE
Neisseria Gonorrhea: NEGATIVE
Trichomonas: NEGATIVE

## 2020-10-15 ENCOUNTER — Institutional Professional Consult (permissible substitution): Payer: Medicaid Other | Admitting: Neurology

## 2020-11-04 ENCOUNTER — Encounter: Payer: Self-pay | Admitting: Neurology

## 2020-11-04 ENCOUNTER — Ambulatory Visit: Payer: Medicaid Other | Admitting: Neurology

## 2020-11-04 VITALS — BP 112/62 | HR 83 | Ht 65.0 in | Wt 201.3 lb

## 2020-11-04 DIAGNOSIS — R351 Nocturia: Secondary | ICD-10-CM | POA: Diagnosis not present

## 2020-11-04 DIAGNOSIS — R519 Headache, unspecified: Secondary | ICD-10-CM

## 2020-11-04 DIAGNOSIS — R0683 Snoring: Secondary | ICD-10-CM

## 2020-11-04 DIAGNOSIS — E669 Obesity, unspecified: Secondary | ICD-10-CM

## 2020-11-04 DIAGNOSIS — R0689 Other abnormalities of breathing: Secondary | ICD-10-CM

## 2020-11-04 DIAGNOSIS — G4719 Other hypersomnia: Secondary | ICD-10-CM | POA: Diagnosis not present

## 2020-11-04 DIAGNOSIS — G478 Other sleep disorders: Secondary | ICD-10-CM

## 2020-11-04 NOTE — Progress Notes (Signed)
Subjective:    Patient ID: Shelley Higgins is a 40 y.o. female.  HPI     Star Age, MD, PhD [facility] [address], [address] [place], [place] [address]  Dear Shelley Higgins,   I saw your patient, I saw your patient, Shelley Higgins, upon your kind request in my sleep clinic today for initial consultation of her sleep disorder, in particular, concern for underlying obstructive sleep apnea.  The patient is unaccompanied today.  As you know, Shelley Higgins is a 40 year old right-handed woman with an underlying medical history of reflux disease, anxiety, history of pneumonia, prior smoking with recent cessation, and obesity, who reports snoring and excessive daytime somnolence.  I reviewed your office note from 09/09/2020.  Her Epworth sleepiness score is 8 out of 24, fatigue severity score is 35 out of 63. She reports that her snoring can be loud and she often wakes herself up from the snoring and the vibrating noise causes her ears to itch.  She has woken up with a sense of gasping for air.  She has woken up with a headache, she has sleep disruption and nonrestorative sleep, multiple nighttime awakenings.  She has nocturia about once per average night.  She lives with her boyfriend and 2 children, ages 21 and 85.  She reports falling asleep with the TV on and it tends to stay on at night until he turns it off.  Bedtime is generally between 930 and 10 and rise time around 7 AM.  She does not currently work.  She drinks caffeine in the form of tea, 1/day on average, typically no daily soda or daily coffee.  She drinks alcohol occasionally.  She quit smoking in September 2021.  She had TB at age 62 and took antibiotics for about 6 months.  She has not had any history of asthma or breathing difficulty at night otherwise.  She has gained weight over time, she is currently working on weight loss.  She may be getting cosmetic surgery done including liposuction but wanted to get checked  out for sleep apnea before she planned any elective surgery.  She wanted to be sure she does not have a sleep-related breathing disorder.  They have 2 dogs in the household, they do not typically sleep in the bedrooms, typically in the laundry room.  Her Past Medical History Is Significant For: Past Medical History:  Diagnosis Date  . Anxiety    Phreesia 09/08/2020  . Dermoid cyst   . GERD (gastroesophageal reflux disease)    Phreesia 09/08/2020  . Pneumonia   . TB (tuberculosis)   . Tuberculosis    Phreesia 09/08/2020    Her Past Surgical History Is Significant For: Past Surgical History:  Procedure Laterality Date  . INDUCED ABORTION      Her Family History Is Significant For: Family History  Problem Relation Age of Onset  . Stroke Mother   . Hypertension Mother   . Diabetes Mother     Her Social History Is Significant For: Social History   Socioeconomic History  . Marital status: Single    Spouse name: Not on file  . Number of children: Not on file  . Years of education: Not on file  . Highest education level: Not on file  Occupational History  . Not on file  Tobacco Use  . Smoking status: Former Smoker    Years: 20.00    Types: Cigarettes    Quit date: 06/04/2020    Years since  quitting: 0.4  . Smokeless tobacco: Never Used  Vaping Use  . Vaping Use: Never used  Substance and Sexual Activity  . Alcohol use: Yes    Comment: socially  . Drug use: No  . Sexual activity: Yes    Birth control/protection: Condom  Other Topics Concern  . Not on file  Social History Narrative  . Not on file   Social Determinants of Health   Financial Resource Strain: Not on file  Food Insecurity: Not on file  Transportation Needs: Not on file  Physical Activity: Not on file  Stress: Not on file  Social Connections: Not on file    Her Allergies Are:  No Known Allergies:   Her Current Medications Are:  Outpatient Encounter Medications as of 11/04/2020  Medication  Sig  . diclofenac Sodium (VOLTAREN) 1 % GEL Apply topically 4 (four) times daily.  . famotidine (PEPCID) 20 MG tablet Take 1 tablet (20 mg total) by mouth daily.  . rosuvastatin (CRESTOR) 10 MG tablet Take 1 tablet (10 mg total) by mouth daily.  . Vitamin D, Ergocalciferol, (DRISDOL) 1.25 MG (50000 UNIT) CAPS capsule Take 1 capsule (50,000 Units total) by mouth every 7 (seven) days.   No facility-administered encounter medications on file as of 11/04/2020.  :  Review of Systems:  Out of a complete 14 point review of systems, all are reviewed and negative with the exception of these symptoms as listed below: Review of Systems  Neurological:       Here for sleep consult, no prior sleep study.  Reports she does snore at night.  Epworth Sleepiness Scale 0= would never doze 1= slight chance of dozing 2= moderate chance of dozing 3= high chance of dozing  Sitting and reading:2 Watching TV:3 Sitting inactive in a public place (ex. Theater or meeting):0 As a passenger in a car for an hour without a break:1 Lying down to rest in the afternoon:2 Sitting and talking to someone:0 Sitting quietly after lunch (no alcohol):0 In a car, while stopped in traffic:0 Total:8     Objective:  Neurological Exam  Physical Exam Physical Examination:   Vitals:   11/04/20 1313  BP: 112/62  Pulse: 83  SpO2: 97%    General Examination: The patient is a very pleasant 40 y.o. female in no acute distress. She appears well-developed and well-nourished and well groomed.   HEENT: Normocephalic, atraumatic, pupils are equal, round and reactive to light, extraocular tracking is good without limitation to gaze excursion or nystagmus noted. Hearing is grossly intact. Face is symmetric with normal facial animation. Speech is clear with no dysarthria noted. There is no hypophonia. There is no lip, neck/head, jaw or voice tremor. Neck is supple with full range of passive and active motion. There are no carotid  bruits on auscultation. Oropharynx exam reveals: mild mouth dryness, adequate dental hygiene with several teeth missing on the bottom, she reports that she has a partial plate that she typically wears but forgot to wear today.  Mallampati is class II.  Neck circumference 15 5/8 inches.  She has a mild overbite.  Airway examination otherwise reveals a mildly crowded airway secondary to smaller airway entry, tonsillar size of about 1+.  Tongue protrudes centrally and palate elevates symmetrically.   Chest: Clear to auscultation without wheezing, rhonchi or crackles noted.  Heart: S1+S2+0, regular and normal without murmurs, rubs or gallops noted.   Abdomen: Soft, non-tender and non-distended with normal bowel sounds appreciated on auscultation.  Extremities: There is no  pitting edema in the distal lower extremities bilaterally.   Skin: Warm and dry without trophic changes noted.   Musculoskeletal: exam reveals no obvious joint deformities, tenderness or joint swelling or erythema.   Neurologically:  Mental status: The patient is awake, alert and oriented in all 4 spheres. Her immediate and remote memory, attention, language skills and fund of knowledge are appropriate. There is no evidence of aphasia, agnosia, apraxia or anomia. Speech is clear with normal prosody and enunciation. Thought process is linear. Mood is normal and affect is normal.  Cranial nerves II - XII are as described above under HEENT exam.  Motor exam: Normal bulk, strength and tone is noted. There is no tremor, Romberg is negative. Fine motor skills and coordination: grossly intact.  Cerebellar testing: No dysmetria or intention tremor. There is no truncal or gait ataxia.  Sensory exam: intact to light touch in the upper and lower extremities.  Gait, station and balance: She stands easily. No veering to one side is noted. No leaning to one side is noted. Posture is age-appropriate and stance is narrow based. Gait shows normal  stride length and normal pace. No problems turning are noted. Tandem walk is unremarkable.                Assessment and Plan:   In summary, Aysia Lowder is a very pleasant 40 y.o.-year old female with an underlying medical history of reflux disease, anxiety, history of pneumonia, prior smoking with recent cessation, and obesity, whose history and physical exam are concerning for obstructive sleep apnea (OSA). I had a long chat with the patient about my findings and the diagnosis of OSA, its prognosis and treatment options. We talked about medical treatments, surgical interventions and non-pharmacological approaches. I explained in particular the risks and ramifications of untreated moderate to severe OSA, especially with respect to developing cardiovascular disease down the Road, including congestive heart failure, difficult to treat hypertension, cardiac arrhythmias, or stroke. Even type 2 diabetes has, in part, been linked to untreated OSA. Symptoms of untreated OSA include daytime sleepiness, memory problems, mood irritability and mood disorder such as depression and anxiety, lack of energy, as well as recurrent headaches, especially morning headaches. We talked about trying to maintain a healthy lifestyle in general, as well as the importance of weight control. We also talked about the importance of good sleep hygiene. I recommended the following at this time: sleep study.   I explained the sleep test procedure to the patient and also outlined possible surgical and non-surgical treatment options of OSA, including the use of a custom-made dental device (which would require a referral to a specialist dentist or oral surgeon). I also explained the PAP treatment option to the patient, who indicated that she would be willing to try CPAP if the need arises. I explained the importance of being compliant with PAP treatment, not only for insurance purposes but primarily to improve Her symptoms, and for the  patient's long term health benefit, including to reduce Her cardiovascular risks. I answered all her questions today and the patient was in agreement. I plan to see her back after the sleep study is completed and encouraged her to call with any interim questions, concerns, problems or updates.   Thank you very much for allowing me to participate in the care of this nice patient. If I can be of any further assistance to you please do not hesitate to call me at [phone].  Sincerely,   Star Age, MD, PhD

## 2020-11-04 NOTE — Patient Instructions (Signed)

## 2020-11-25 ENCOUNTER — Ambulatory Visit (INDEPENDENT_AMBULATORY_CARE_PROVIDER_SITE_OTHER): Payer: Medicaid Other | Admitting: Neurology

## 2020-11-25 DIAGNOSIS — E669 Obesity, unspecified: Secondary | ICD-10-CM

## 2020-11-25 DIAGNOSIS — G4719 Other hypersomnia: Secondary | ICD-10-CM

## 2020-11-25 DIAGNOSIS — R519 Headache, unspecified: Secondary | ICD-10-CM

## 2020-11-25 DIAGNOSIS — G478 Other sleep disorders: Secondary | ICD-10-CM

## 2020-11-25 DIAGNOSIS — G4710 Hypersomnia, unspecified: Secondary | ICD-10-CM

## 2020-11-25 DIAGNOSIS — R0683 Snoring: Secondary | ICD-10-CM

## 2020-11-25 DIAGNOSIS — R351 Nocturia: Secondary | ICD-10-CM

## 2020-11-25 DIAGNOSIS — R0689 Other abnormalities of breathing: Secondary | ICD-10-CM

## 2020-11-25 DIAGNOSIS — E66811 Obesity, class 1: Secondary | ICD-10-CM

## 2020-11-28 NOTE — Progress Notes (Signed)
Patient referred by [name] Just, NP, seen by me on 11/04/20, HST on 11/25/20.   Please call and notify the patient that the recent home sleep test did not show any significant obstructive sleep apnea. Some snoring was noted and appeared to be intermittent, in the mild to moderate range.  Weight loss and avoiding the supine sleep position will likely aid in reducing her snoring. She can follow up with the referring provider.  Thanks.  Star Age, MD, PhD [facility])

## 2020-11-28 NOTE — Procedures (Signed)
   [facility] ([facility])  STUDY DATE: 11/25/20  DOB: September 04, 1981  MRN: [id number]  ORDERING CLINICIAN: Star Age, MD, PhD   REFERRING CLINICIAN: Just, Laurita Quint, FNP   CLINICAL INFORMATION/HISTORY: 40 year old woman with a history of reflux disease, anxiety, history of pneumonia, prior smoking with recent cessation, and obesity, who reports snoring and excessive daytime somnolence.  Epworth sleepiness score: 8/24.  BMI: 33.4 kg/m  Neck Circumference: 15"  FINDINGS:   Total Record Time (hours, min): 6 H 25 min  Total Sleep Time (hours, min):  4 H 4 min   Percent REM (%):    21.71 %   Calculated pAHI (per hour): 2.1      REM pAHI: 5.8    NREM pAHI: 1.0 Supine AHI: 2.2   Oxygen Saturation (%) Mean: 97  Minimum oxygen saturation (%):         89   O2 Saturation Range (%): 89-100  O2Saturation (minutes) <=88%: 0 min  Pulse Mean (bpm):    67  Pulse Range (32-108)   IMPRESSION: Primary snoring   RECOMMENDATION:  This home sleep test does not demonstrate any significant obstructive or central sleep disordered breathing. Some snoring was noted and appeared to be intermittent, in the mild to moderate range.  Weight loss and avoiding the supine sleep position will likely aid in reducing her snoring. Other causes of the patient's symptoms, including circadian rhythm disturbances, an underlying mood disorder, medication effect and/or an underlying medical problem cannot be ruled out based on this test. Clinical correlation is recommended. The patient should be cautioned not to drive, work at heights, or operate dangerous or heavy equipment when tired or sleepy. Review and reiteration of good sleep hygiene measures should be pursued with any patient. The patient can follow up with her referring provider, who will be notified of the test results.  I certify that I have reviewed the raw data recording prior to the issuance of this report in accordance with the  standards of the American Academy of Sleep Medicine (AASM).  INTERPRETING PHYSICIAN:  Star Age, MD, PhD  Board Certified in Neurology and Sleep Medicine  [facility] [address], [place], [place] [address] [phone]

## 2020-12-02 ENCOUNTER — Telehealth: Payer: Self-pay

## 2020-12-02 ENCOUNTER — Ambulatory Visit (INDEPENDENT_AMBULATORY_CARE_PROVIDER_SITE_OTHER): Payer: Medicaid Other | Admitting: Family Medicine

## 2020-12-02 ENCOUNTER — Other Ambulatory Visit: Payer: Self-pay

## 2020-12-02 ENCOUNTER — Encounter: Payer: Self-pay | Admitting: Family Medicine

## 2020-12-02 VITALS — BP 115/74 | HR 74 | Temp 97.8°F | Ht 65.0 in | Wt 195.0 lb

## 2020-12-02 DIAGNOSIS — R7303 Prediabetes: Secondary | ICD-10-CM | POA: Diagnosis not present

## 2020-12-02 DIAGNOSIS — E782 Mixed hyperlipidemia: Secondary | ICD-10-CM

## 2020-12-02 DIAGNOSIS — E559 Vitamin D deficiency, unspecified: Secondary | ICD-10-CM

## 2020-12-02 NOTE — Telephone Encounter (Signed)
-----   Message from Star Age, MD sent at 11/28/2020  4:57 PM EST ----- Patient referred by [name] Just, NP, seen by me on 11/04/20, HST on 11/25/20.   Please call and notify the patient that the recent home sleep test did not show any significant obstructive sleep apnea. Some snoring was noted and appeared to be intermittent, in the mild to moderate range.  Weight loss and avoiding the supine sleep position will likely aid in reducing her snoring. She can follow up with the referring provider.  Thanks.  Star Age, MD, PhD [facility])

## 2020-12-02 NOTE — Progress Notes (Signed)
3/7/202211:04 AM  Shelley Higgins [birth year], 40 y.o., female [id number]  Chief Complaint  Patient presents with  . 3 month follow up     HPI:   Patient is a 40 y.o. female with past medical history significant for HLD, vitamin d deficiency, who presents today for chronic care follow up.  Frequent issues with loud snoring The snoring wakes her up at night Did a sleep study, all looked well Stopped smoking 6 months ago Encouraged to continue weight loss  heartburn: takes famotidine as needed Happy with current regimen  vitamin d deficiency Started weekly vitamin D replacement Last vitamin D Lab Results  Component Value Date   VD25OH 12.0 (L) 09/09/2020     HLD Started Crestor Lab Results  Component Value Date   CHOL 224 (H) 09/09/2020   HDL 56 09/09/2020   LDLCALC 151 (H) 09/09/2020   TRIG 98 09/09/2020   CHOLHDL 4.0 09/09/2020    Wt Readings from Last 3 Encounters:  12/02/20 195 lb (88.5 kg)  11/04/20 201 lb 5 oz (91.3 kg)  10/07/20 202 lb (91.6 kg)  Would like weight loss surgery Has been working on weight loss Has stopped soda and cutting back on cholesterol Has been increasing and walking daily   Health Maintenance  Topic Date Due  . COVID-19 Vaccine (1) Never done  . INFLUENZA VACCINE  12/26/2020 (Originally 04/28/2020)  . PAP SMEAR-Modifier  10/08/2023  . TETANUS/TDAP  09/09/2030  . Hepatitis C Screening  Completed  . HIV Screening  Completed  . HPV VACCINES  Aged Out      Depression screen Shelley Higgins 2/9 12/02/2020 09/09/2020  Decreased Interest 0 0  Down, Depressed, Hopeless 0 0  PHQ - 2 Score 0 0    Fall Risk  12/02/2020 09/09/2020  Falls in the past year? 0 0  Number falls in past yr: 0 0  Injury with Fall? 0 0  Follow up Falls evaluation completed Falls evaluation completed     No Known Allergies  Prior to Admission medications   Medication Sig Start Date End Date Taking? Authorizing Provider  diclofenac Sodium (VOLTAREN) 1 % GEL  Apply topically 4 (four) times daily.   Yes [provider]  famotidine (PEPCID) 20 MG tablet Take 1 tablet (20 mg total) by mouth daily. 09/09/20  Yes Naia Ruff, Laurita Quint, FNP  rosuvastatin (CRESTOR) 10 MG tablet Take 1 tablet (10 mg total) by mouth daily. 09/10/20  Yes Annely Sliva, Laurita Quint, FNP  Vitamin D, Ergocalciferol, (DRISDOL) 1.25 MG (50000 UNIT) CAPS capsule Take 1 capsule (50,000 Units total) by mouth every 7 (seven) days. 09/10/20  Yes Shoua Ressler, Laurita Quint, FNP    Past Medical History:  Diagnosis Date  . Anxiety    Phreesia 09/08/2020  . Dermoid cyst   . GERD (gastroesophageal reflux disease)    Phreesia 09/08/2020  . Pneumonia   . TB (tuberculosis)   . Tuberculosis    Phreesia 09/08/2020    Past Surgical History:  Procedure Laterality Date  . INDUCED ABORTION      Social History   Tobacco Use  . Smoking status: Former Smoker    Years: 20.00    Types: Cigarettes    Quit date: 06/04/2020    Years since quitting: 0.4  . Smokeless tobacco: Never Used  Substance Use Topics  . Alcohol use: Yes    Comment: socially    Family History  Problem Relation Age of Onset  . Stroke Mother   . Hypertension Mother   .  Diabetes Mother     Review of Systems  Constitutional: Negative for chills, fever and malaise/fatigue.  Eyes: Negative for blurred vision and double vision.  Respiratory: Negative for cough, shortness of breath and wheezing.   Cardiovascular: Negative for chest pain, palpitations and leg swelling.  Gastrointestinal: Negative for abdominal pain, blood in stool, constipation, diarrhea, heartburn, nausea and vomiting.  Genitourinary: Negative for dysuria, frequency, hematuria and urgency.  Musculoskeletal: Negative for back pain and joint pain.  Skin: Negative for rash.  Neurological: Negative for dizziness, weakness and headaches.     OBJECTIVE:  Today's Vitals   12/02/20 1036  BP: 115/74  Pulse: 74  Temp: 97.8 F (36.6 C)  SpO2: 100%  Weight: 195 lb  (88.5 kg)  Height: '5\' 5"'  (1.651 m)   Body mass index is 32.45 kg/m.   Physical Exam Constitutional:      General: She is not in acute distress.    Appearance: Normal appearance. She is not ill-appearing.  HENT:     Head: Normocephalic.  Cardiovascular:     Rate and Rhythm: Normal rate and regular rhythm.     Pulses: Normal pulses.     Heart sounds: Normal heart sounds. No murmur heard. No friction rub. No gallop.   Pulmonary:     Effort: Pulmonary effort is normal. No respiratory distress.     Breath sounds: Normal breath sounds. No stridor. No wheezing, rhonchi or rales.  Abdominal:     General: Bowel sounds are normal.     Palpations: Abdomen is soft.     Tenderness: There is no abdominal tenderness.  Musculoskeletal:     Right lower leg: No edema.     Left lower leg: No edema.  Skin:    General: Skin is warm and dry.  Neurological:     Mental Status: She is alert and oriented to person, place, and time.  Psychiatric:        Mood and Affect: Mood normal.        Behavior: Behavior normal.      No results found for this or any previous visit (from the past 24 hour(s)).  No results found.   ASSESSMENT and PLAN  Problem List Items Addressed This Visit      Other   Vitamin D deficiency - Primary   Relevant Orders   Vitamin D, 25-hydroxy   Hyperlipidemia   Relevant Orders   CMP14+EGFR   Lipid Panel   Pre-diabetes   Relevant Orders   Hemoglobin A1c     Will follow up with lab results Continue LFM  Return in about 3 months (around 03/04/2021).    [name] Ivelis Norgard, FNP-BC [facility] [place], [place] [address] Ph.  [phone] Fax [phone]

## 2020-12-02 NOTE — Patient Instructions (Addendum)
  Health Maintenance, Female Adopting a healthy lifestyle and getting preventive care are important in promoting health and wellness. Ask your health care provider about:  The right schedule for you to have regular tests and exams.  Things you can do on your own to prevent diseases and keep yourself healthy. What should I know about diet, weight, and exercise? Eat a healthy diet  Eat a diet that includes plenty of vegetables, fruits, low-fat dairy products, and lean protein.  Do not eat a lot of foods that are high in solid fats, added sugars, or sodium.   Maintain a healthy weight Body mass index (BMI) is used to identify weight problems. It estimates body fat based on height and weight. Your health care provider can help determine your BMI and help you achieve or maintain a healthy weight. Get regular exercise Get regular exercise. This is one of the most important things you can do for your health. Most adults should:  Exercise for at least 150 minutes each week. The exercise should increase your heart rate and make you sweat (moderate-intensity exercise).  Do strengthening exercises at least twice a week. This is in addition to the moderate-intensity exercise.  Spend less time sitting. Even light physical activity can be beneficial. Watch cholesterol and blood lipids Have your blood tested for lipids and cholesterol at 39 years of age, then have this test every 5 years. Have your cholesterol levels checked more often if:  Your lipid or cholesterol levels are high.  You are older than 40 years of age.  You are at high risk for heart disease. What should I know about cancer screening? Depending on your health history and family history, you may need to have cancer screening at various ages. This may include screening for:  Breast cancer.  Cervical cancer.  Colorectal cancer.  Skin cancer.  Lung cancer. What should I know about heart disease, diabetes, and high blood  pressure? Blood pressure and heart disease  High blood pressure causes heart disease and increases the risk of stroke. This is more likely to develop in people who have high blood pressure readings, are of African descent, or are overweight.  Have your blood pressure checked: ? Every 3-5 years if you are 18-39 years of age. ? Every year if you are 40 years old or older. Diabetes Have regular diabetes screenings. This checks your fasting blood sugar level. Have the screening done:  Once every three years after age 40 if you are at a normal weight and have a low risk for diabetes.  More often and at a younger age if you are overweight or have a high risk for diabetes. What should I know about preventing infection? Hepatitis B If you have a higher risk for hepatitis B, you should be screened for this virus. Talk with your health care provider to find out if you are at risk for hepatitis B infection. Hepatitis C Testing is recommended for:  Everyone born from 1945 through 1965.  Anyone with known risk factors for hepatitis C. Sexually transmitted infections (STIs)  Get screened for STIs, including gonorrhea and chlamydia, if: ? You are sexually active and are younger than 39 years of age. ? You are older than 39 years of age and your health care provider tells you that you are at risk for this type of infection. ? Your sexual activity has changed since you were last screened, and you are at increased risk for chlamydia or gonorrhea. Ask your health   care provider if you are at risk.  Ask your health care provider about whether you are at high risk for HIV. Your health care provider may recommend a prescription medicine to help prevent HIV infection. If you choose to take medicine to prevent HIV, you should first get tested for HIV. You should then be tested every 3 months for as long as you are taking the medicine. Pregnancy  If you are about to stop having your period (premenopausal) and  you may become pregnant, seek counseling before you get pregnant.  Take 400 to 800 micrograms (mcg) of folic acid every day if you become pregnant.  Ask for birth control (contraception) if you want to prevent pregnancy. Osteoporosis and menopause Osteoporosis is a disease in which the bones lose minerals and strength with aging. This can result in bone fractures. If you are 65 years old or older, or if you are at risk for osteoporosis and fractures, ask your health care provider if you should:  Be screened for bone loss.  Take a calcium or vitamin D supplement to lower your risk of fractures.  Be given hormone replacement therapy (HRT) to treat symptoms of menopause. Follow these instructions at home: Lifestyle  Do not use any products that contain nicotine or tobacco, such as cigarettes, e-cigarettes, and chewing tobacco. If you need help quitting, ask your health care provider.  Do not use street drugs.  Do not share needles.  Ask your health care provider for help if you need support or information about quitting drugs. Alcohol use  Do not drink alcohol if: ? Your health care provider tells you not to drink. ? You are pregnant, may be pregnant, or are planning to become pregnant.  If you drink alcohol: ? Limit how much you use to 0-1 drink a day. ? Limit intake if you are breastfeeding.  Be aware of how much alcohol is in your drink. In the [place] one drink equals one 12 oz bottle of beer (355 mL), one 5 oz glass of wine (148 mL), or one 1 oz glass of hard liquor (44 mL). General instructions  Schedule regular health, dental, and eye exams.  Stay current with your vaccines.  Tell your health care provider if: ? You often feel depressed. ? You have ever been abused or do not feel safe at home. Summary  Adopting a healthy lifestyle and getting preventive care are important in promoting health and wellness.  Follow your health care provider's instructions about healthy  diet, exercising, and getting tested or screened for diseases.  Follow your health care provider's instructions on monitoring your cholesterol and blood pressure. This information is not intended to replace advice given to you by your health care provider. Make sure you discuss any questions you have with your health care provider. Document Revised: 09/07/2018 Document Reviewed: 09/07/2018 Elsevier Patient Education  2021 Elsevier Inc.   If you have lab work done today you will be contacted with your lab results within the next 2 weeks.  If you have not heard from us then please contact us. The fastest way to get your results is to register for My Chart.   IF you received an x-ray today, you will receive an invoice from [facility]. Please contact [facility] at [phone] with questions or concerns regarding your invoice.   IF you received labwork today, you will receive an invoice from [facility]. Please contact [facility] at [phone] with questions or concerns regarding your invoice.   Our billing   staff will not be able to assist you with questions regarding bills from these companies.  You will be contacted with the lab results as soon as they are available. The fastest way to get your results is to activate your My Chart account. Instructions are located on the last page of this paperwork. If you have not heard from us regarding the results in 2 weeks, please contact this office.      

## 2020-12-02 NOTE — Telephone Encounter (Signed)
Call patient, and we reviewed results of sleep study.  Patient verbalized understanding of results and will follow up with nurse practitioner.  Patient had no questions/concerns at the time of

## 2020-12-03 LAB — HEMOGLOBIN A1C
Est. average glucose Bld gHb Est-mCnc: 123 mg/dL
Hgb A1c MFr Bld: 5.9 % — ABNORMAL HIGH (ref 4.8–5.6)

## 2020-12-03 LAB — CMP14+EGFR
ALT: 20 IU/L (ref 0–32)
AST: 26 IU/L (ref 0–40)
Albumin/Globulin Ratio: 1.9 (ref 1.2–2.2)
Albumin: 4.6 g/dL (ref 3.8–4.8)
Alkaline Phosphatase: 51 IU/L (ref 44–121)
BUN/Creatinine Ratio: 9 (ref 9–23)
BUN: 9 mg/dL (ref 6–20)
Bilirubin Total: 0.4 mg/dL (ref 0.0–1.2)
CO2: 24 mmol/L (ref 20–29)
Calcium: 9.2 mg/dL (ref 8.7–10.2)
Chloride: 100 mmol/L (ref 96–106)
Creatinine, Ser: 0.95 mg/dL (ref 0.57–1.00)
Globulin, Total: 2.4 g/dL (ref 1.5–4.5)
Glucose: 89 mg/dL (ref 65–99)
Potassium: 4.3 mmol/L (ref 3.5–5.2)
Sodium: 138 mmol/L (ref 134–144)
Total Protein: 7 g/dL (ref 6.0–8.5)
eGFR: 79 mL/min/{1.73_m2} (ref >59)

## 2020-12-03 LAB — LIPID PANEL
Chol/HDL Ratio: 2.5 ratio (ref 0.0–4.4)
Cholesterol, Total: 149 mg/dL (ref 100–199)
HDL: 59 mg/dL (ref >39)
LDL Chol Calc (NIH): 79 mg/dL (ref 0–99)
Triglycerides: 52 mg/dL (ref 0–149)
VLDL Cholesterol Cal: 11 mg/dL (ref 5–40)

## 2020-12-03 LAB — VITAMIN D 25 HYDROXY (VIT D DEFICIENCY, FRACTURES): Vit D, 25-Hydroxy: 24.5 ng/mL — ABNORMAL LOW (ref 30.0–100.0)

## 2021-01-17 ENCOUNTER — Other Ambulatory Visit: Payer: Self-pay

## 2021-01-17 ENCOUNTER — Encounter (HOSPITAL_COMMUNITY): Payer: Self-pay | Admitting: Emergency Medicine

## 2021-01-17 ENCOUNTER — Encounter (HOSPITAL_COMMUNITY): Payer: Self-pay

## 2021-01-17 ENCOUNTER — Emergency Department (HOSPITAL_COMMUNITY)
Admission: EM | Admit: 2021-01-17 | Discharge: 2021-01-18 | Disposition: A | Discharge status: Home / Self Care | Payer: Medicaid Other | Attending: Emergency Medicine | Admitting: Emergency Medicine

## 2021-01-17 ENCOUNTER — Ambulatory Visit (HOSPITAL_COMMUNITY)
Admission: EM | Admit: 2021-01-17 | Discharge: 2021-01-17 | Disposition: A | Discharge status: Home / Self Care | Payer: Medicaid Other

## 2021-01-17 DIAGNOSIS — Z87891 Personal history of nicotine dependence: Secondary | ICD-10-CM | POA: Insufficient documentation

## 2021-01-17 DIAGNOSIS — M25561 Pain in right knee: Secondary | ICD-10-CM | POA: Diagnosis not present

## 2021-01-17 DIAGNOSIS — M79661 Pain in right lower leg: Secondary | ICD-10-CM | POA: Diagnosis not present

## 2021-01-17 DIAGNOSIS — M79604 Pain in right leg: Secondary | ICD-10-CM | POA: Diagnosis not present

## 2021-01-17 LAB — BASIC METABOLIC PANEL
Anion gap: 5 (ref 5–15)
BUN: 11 mg/dL (ref 6–20)
CO2: 32 mmol/L (ref 22–32)
Calcium: 9.2 mg/dL (ref 8.9–10.3)
Chloride: 102 mmol/L (ref 98–111)
Creatinine, Ser: 1.01 mg/dL — ABNORMAL HIGH (ref 0.44–1.00)
GFR, Estimated: >60 mL/min (ref >60)
Glucose, Bld: 91 mg/dL (ref 70–99)
Potassium: 3.9 mmol/L (ref 3.5–5.1)
Sodium: 139 mmol/L (ref 135–145)

## 2021-01-17 LAB — CBC WITH DIFFERENTIAL/PLATELET
Abs Immature Granulocytes: 0.02 10*3/uL (ref 0.00–0.07)
Basophils Absolute: 0 10*3/uL (ref 0.0–0.1)
Basophils Relative: 1 %
Eosinophils Absolute: 0 10*3/uL (ref 0.0–0.5)
Eosinophils Relative: 1 %
HCT: 39.5 % (ref 36.0–46.0)
Hemoglobin: 12.6 g/dL (ref 12.0–15.0)
Immature Granulocytes: 0 %
Lymphocytes Relative: 26 %
Lymphs Abs: 1.8 10*3/uL (ref 0.7–4.0)
MCH: 28.1 pg (ref 26.0–34.0)
MCHC: 31.9 g/dL (ref 30.0–36.0)
MCV: 88.2 fL (ref 80.0–100.0)
Monocytes Absolute: 0.7 10*3/uL (ref 0.1–1.0)
Monocytes Relative: 9 %
Neutro Abs: 4.5 10*3/uL (ref 1.7–7.7)
Neutrophils Relative %: 63 %
Platelets: 317 10*3/uL (ref 150–400)
RBC: 4.48 MIL/uL (ref 3.87–5.11)
RDW: 12.5 % (ref 11.5–15.5)
WBC: 7 10*3/uL (ref 4.0–10.5)
nRBC: 0 % (ref 0.0–0.2)

## 2021-01-17 NOTE — ED Triage Notes (Signed)
Pt presents today with c/o of right posterior leg pain x 3 days, denies injury. +swelling

## 2021-01-17 NOTE — ED Triage Notes (Signed)
Patient reports R knee/leg pain, intermittent x 3 days, reports swelling noted yesterday at [facility], denies injury

## 2021-01-17 NOTE — ED Provider Notes (Signed)
[facility]    CSN: [id number] Arrival date & time: 01/17/21  1811      History   Chief Complaint Chief Complaint  Patient presents with  . Leg Pain    right    HPI Shelley Higgins is a 40 y.o. female.   HPI   Leg Pain: Patient states that for the past 3 days she has had right posterior calf pain.  No known injury.  She has noticed leg swelling, pain with walking.  She states that the pain feels like a sharp ache to a stabbing pain in nature.  She has not had any chest pain or shortness of breath nor has she had any fever.  She is not on any hormonal medications at this time.  No recent surgeries or procedures.  She is unsure of a family history of clotting diseases as she recalls that her mom had strokes and blood clots early in life.  Past Medical History:  Diagnosis Date  . Anxiety    Phreesia 09/08/2020  . Dermoid cyst   . GERD (gastroesophageal reflux disease)    Phreesia 09/08/2020  . Pneumonia   . TB (tuberculosis)   . Tuberculosis    Phreesia 09/08/2020    Patient Active Problem List   Diagnosis Date Noted  . Vitamin D deficiency 09/10/2020  . Hyperlipidemia 09/10/2020  . Pre-diabetes 09/10/2020  . GERD (gastroesophageal reflux disease) 09/09/2020  . Ovarian cyst 08/13/2014    Past Surgical History:  Procedure Laterality Date  . INDUCED ABORTION      OB History    Gravida  3   Para  2   Term  2   Preterm      AB  1   Living  2     SAB      IAB  1   Ectopic      Multiple      Live Births               Home Medications    Prior to Admission medications   Medication Sig Start Date End Date Taking? Authorizing Provider  rosuvastatin (CRESTOR) 10 MG tablet Take 1 tablet (10 mg total) by mouth daily. 09/10/20  Yes Just, Laurita Quint, FNP  Vitamin D, Ergocalciferol, (DRISDOL) 1.25 MG (50000 UNIT) CAPS capsule Take 1 capsule (50,000 Units total) by mouth every 7 (seven) days. 09/10/20  Yes Just, Laurita Quint, FNP  diclofenac  Sodium (VOLTAREN) 1 % GEL Apply topically 4 (four) times daily.    [provider]  famotidine (PEPCID) 20 MG tablet Take 1 tablet (20 mg total) by mouth daily. Patient not taking: Reported on 12/02/2020 10/07/20   Just, Laurita Quint, FNP  Vitamin D, Ergocalciferol, 50 MCG (2000 UT) CAPS Take by mouth.    [provider]    Family History Family History  Problem Relation Age of Onset  . Stroke Mother   . Hypertension Mother   . Diabetes Mother     Social History Social History   Tobacco Use  . Smoking status: Former Smoker    Years: 20.00    Types: Cigarettes    Quit date: 06/04/2020    Years since quitting: 0.6  . Smokeless tobacco: Never Used  Vaping Use  . Vaping Use: Never used  Substance Use Topics  . Alcohol use: Yes    Comment: socially  . Drug use: No     Allergies   Patient has no known allergies.   Review of  Systems Review of Systems  As stated above in HPI Physical Exam Triage Vital Signs ED Triage Vitals  Enc Vitals Group     BP 01/17/21 1824 116/61     Pulse Rate 01/17/21 1824 (!) 106     Resp 01/17/21 1824 16     Temp 01/17/21 1824 98.8 F (37.1 C)     Temp Source 01/17/21 1824 Oral     SpO2 01/17/21 1824 100 %     Weight --      Height --      Head Circumference --      Peak Flow --      Pain Score 01/17/21 1821 7     Pain Loc --      Pain Edu? --      Excl. in [place]? --    No data found.  Updated Vital Signs BP 116/61 (BP Location: Right Arm)   Pulse (!) 106   Temp 98.8 F (37.1 C) (Oral)   Resp 16   LMP 01/14/2021   SpO2 100%   Physical Exam Vitals and nursing note reviewed.  Constitutional:      General: She is not in acute distress.    Appearance: Normal appearance. She is not ill-appearing, toxic-appearing or diaphoretic.  Cardiovascular:     Rate and Rhythm: Normal rate and regular rhythm.     Heart sounds: Normal heart sounds.  Pulmonary:     Effort: Pulmonary effort is normal.     Breath sounds: Normal  breath sounds.  Musculoskeletal:        General: Tenderness (right posterior knee and calf tenderness. Positive right homans sign ) present. No swelling.     Comments: Right calf circumference 15.5 inches, left calf circumference 14.5 inches  Skin:    General: Skin is warm.     Capillary Refill: Capillary refill takes less than 2 seconds.     Coloration: Skin is not jaundiced or pale.  Neurological:     Mental Status: She is alert.      UC Treatments / Results  Labs (all labs ordered are listed, but only abnormal results are displayed) Labs Reviewed - No data to display  EKG   Radiology No results found.  Procedures Procedures (including critical care time)  Medications Ordered in UC Medications - No data to display  Initial Impression / Assessment and Plan / UC Course  I have reviewed the triage vital signs and the nursing notes.  Pertinent labs & imaging results that were available during my care of the patient were reviewed by me and considered in my medical decision making (see chart for details).    New. Referring to the ED for additional work up given concern for DVT to reduce risk of systemic injury such as PE.  Final Clinical Impressions(s) / UC Diagnoses   Final diagnoses:  None   Discharge Instructions   None    ED Prescriptions    None     PDMP not reviewed this encounter.   Hughie Closs, [place] 01/17/21 1912

## 2021-01-17 NOTE — ED Triage Notes (Signed)
Emergency Medicine Provider Triage Evaluation Note  Shelley Higgins , a 40 y.o. female  was evaluated in triage.  Pt complains of right lower extremity pain.  Patient reports pain from just above her popliteal area down into her calf.  Patient reports pain has been intermittent over the last 3 days.  Patient reported swelling to her right lower extremity yesterday.  Denies any color change, numbness or tingling, extremity.  Denies any personal history of DVT or PE.  Review of Systems  Positive: Right lower extremity pain  Negative: color change, numbness or tingling, extremity  Physical Exam  BP 108/64 (BP Location: Right Arm)   Pulse 81   Temp 98.6 F (37 C) (Oral)   Resp (!) 24   Ht 5\' 5"  (1.651 m)   Wt 88.5 kg   LMP 01/14/2021   SpO2 100%   BMI 32.45 kg/m  Gen:   Awake, no distress   HEENT:  Atraumatic  Resp:  Normal effort  Cardiac:  Normal rate, +3 posterior tibialis and dorsal pedis pulse on the right foot and ankle Abd:   Nondistended, nontender  MSK:   Moves extremities without difficulty, minimal swelling to right calf, ttp right calf and popliteal area Neuro:  Speech clear   Medical Decision Making  Medically screening exam initiated at 8:34 PM.  Appropriate orders placed.  Takenya Travaglini was informed that the remainder of the evaluation will be completed by another provider, this initial triage assessment does not replace that evaluation, and the importance of remaining in the ED until their evaluation is complete.  Clinical Impression   The patient appears stable so that the remainder of the work up may be completed by another provider.      Loni Beckwith, [place] 01/17/21 2037

## 2021-01-18 ENCOUNTER — Ambulatory Visit (HOSPITAL_COMMUNITY)
Admission: RE | Admit: 2021-01-18 | Discharge: 2021-01-18 | Disposition: A | Discharge status: Home / Self Care | Payer: Medicaid Other | Source: Ambulatory Visit | Attending: Emergency Medicine | Admitting: Emergency Medicine

## 2021-01-18 ENCOUNTER — Encounter (HOSPITAL_COMMUNITY): Payer: Self-pay | Admitting: Emergency Medicine

## 2021-01-18 DIAGNOSIS — M7989 Other specified soft tissue disorders: Secondary | ICD-10-CM | POA: Insufficient documentation

## 2021-01-18 MED ORDER — LIDOCAINE 5 % EX PTCH
1.0000 | MEDICATED_PATCH | CUTANEOUS | Status: DC
  Administered 2021-01-18: 1 via TRANSDERMAL
  Filled 2021-01-18: qty 1

## 2021-01-18 NOTE — ED Provider Notes (Signed)
[facility] EMERGENCY DEPARTMENT Provider Note   CSN: [id number] Arrival date & time: 01/17/21  1926     History Chief Complaint  Patient presents with  . Leg Pain    Shelley Higgins is a 40 y.o. female.  The history is provided by the patient.  Leg Pain Location:  Leg Time since incident:  3 days Injury: no   Leg location:  R leg Pain details:    Quality:  Sharp   Radiates to:  Does not radiate   Severity:  Moderate   Onset quality:  Gradual   Duration:  3 days   Timing:  Constant   Progression:  Waxing and waning Chronicity:  New Foreign body present:  No foreign bodies Prior injury to area:  No Relieved by:  Nothing Worsened by:  Nothing Ineffective treatments:  Heat and ice Associated symptoms: no back pain, no decreased ROM, no fever and no numbness   Risk factors: no concern for non-accidental trauma   Patient with r lateral popliteal pain and R lateral leg pain.  No OCP, no travel, no CP no SOB.       Past Medical History:  Diagnosis Date  . Anxiety    Phreesia 09/08/2020  . Dermoid cyst   . GERD (gastroesophageal reflux disease)    Phreesia 09/08/2020  . Pneumonia   . TB (tuberculosis)   . Tuberculosis    Phreesia 09/08/2020    Patient Active Problem List   Diagnosis Date Noted  . Vitamin D deficiency 09/10/2020  . Hyperlipidemia 09/10/2020  . Pre-diabetes 09/10/2020  . GERD (gastroesophageal reflux disease) 09/09/2020  . Ovarian cyst 08/13/2014    Past Surgical History:  Procedure Laterality Date  . INDUCED ABORTION       OB History    Gravida  3   Para  2   Term  2   Preterm      AB  1   Living  2     SAB      IAB  1   Ectopic      Multiple      Live Births              Family History  Problem Relation Age of Onset  . Stroke Mother   . Hypertension Mother   . Diabetes Mother     Social History   Tobacco Use  . Smoking status: Former Smoker    Years: 20.00    Types: Cigarettes     Quit date: 06/04/2020    Years since quitting: 0.6  . Smokeless tobacco: Never Used  Vaping Use  . Vaping Use: Never used  Substance Use Topics  . Alcohol use: Yes    Comment: socially  . Drug use: No    Home Medications Prior to Admission medications   Medication Sig Start Date End Date Taking? Authorizing Provider  diclofenac Sodium (VOLTAREN) 1 % GEL Apply topically 4 (four) times daily.    [provider]  famotidine (PEPCID) 20 MG tablet Take 1 tablet (20 mg total) by mouth daily. Patient not taking: Reported on 12/02/2020 10/07/20   Just, Laurita Quint, FNP  rosuvastatin (CRESTOR) 10 MG tablet Take 1 tablet (10 mg total) by mouth daily. 09/10/20   Just, Laurita Quint, FNP  Vitamin D, Ergocalciferol, (DRISDOL) 1.25 MG (50000 UNIT) CAPS capsule Take 1 capsule (50,000 Units total) by mouth every 7 (seven) days. 09/10/20   Just, Laurita Quint, FNP  Vitamin D, Ergocalciferol, 50 MCG (  2000 UT) CAPS Take by mouth.    [provider]    Allergies    Patient has no known allergies.  Review of Systems   Review of Systems  Constitutional: Negative for fever.  HENT: Negative for congestion.   Eyes: Negative for visual disturbance.  Respiratory: Negative for shortness of breath.   Cardiovascular: Negative for chest pain and leg swelling.  Gastrointestinal: Negative for abdominal pain.  Musculoskeletal: Positive for arthralgias. Negative for back pain.  Skin: Negative for rash.  Neurological: Negative for dizziness.  Psychiatric/Behavioral: Negative for agitation.  All other systems reviewed and are negative.   Physical Exam Updated Vital Signs BP 124/71 (BP Location: Left Arm)   Pulse 72   Temp 99 F (37.2 C) (Oral)   Resp 18   Ht 5\' 5"  (1.651 m)   Wt 88.5 kg   LMP 01/14/2021   SpO2 100%   BMI 32.45 kg/m   Physical Exam Vitals and nursing note reviewed.  Constitutional:      General: She is not in acute distress.    Appearance: Normal appearance.  HENT:     Head:  Normocephalic and atraumatic.     Nose: Nose normal.  Eyes:     Conjunctiva/sclera: Conjunctivae normal.     Pupils: Pupils are equal, round, and reactive to light.  Cardiovascular:     Rate and Rhythm: Normal rate and regular rhythm.     Pulses: Normal pulses.     Heart sounds: Normal heart sounds.  Pulmonary:     Effort: Pulmonary effort is normal.     Breath sounds: Normal breath sounds.  Abdominal:     General: Abdomen is flat. Bowel sounds are normal.     Palpations: Abdomen is soft.     Tenderness: There is no abdominal tenderness. There is no guarding.  Musculoskeletal:        General: No swelling or tenderness. Normal range of motion.     Cervical back: Normal range of motion and neck supple.     Right lower leg: No edema.     Comments: Negative Homan's sign,  Intact achilles tendon, FROM, 3+ dorsalis pedis   Skin:    General: Skin is warm and dry.     Capillary Refill: Capillary refill takes less than 2 seconds.  Neurological:     General: No focal deficit present.     Mental Status: She is alert and oriented to person, place, and time.     Deep Tendon Reflexes: Reflexes normal.  Psychiatric:        Mood and Affect: Mood normal.        Behavior: Behavior normal.     ED Results / Procedures / Treatments   Labs (all labs ordered are listed, but only abnormal results are displayed) Results for orders placed or performed during the hospital encounter of 15/40/08  Basic metabolic panel  Result Value Ref Range   Sodium 139 135 - 145 mmol/L   Potassium 3.9 3.5 - 5.1 mmol/L   Chloride 102 98 - 111 mmol/L   CO2 32 22 - 32 mmol/L   Glucose, Bld 91 70 - 99 mg/dL   BUN 11 6 - 20 mg/dL   Creatinine, Ser 1.01 (H) 0.44 - 1.00 mg/dL   Calcium 9.2 8.9 - 10.3 mg/dL   GFR, Estimated >60 >60 mL/min   Anion gap 5 5 - 15  CBC with Differential  Result Value Ref Range   WBC 7.0 4.0 - 10.5 K/uL  RBC 4.48 3.87 - 5.11 MIL/uL   Hemoglobin 12.6 12.0 - 15.0 g/dL   HCT 39.5 36.0 -  46.0 %   MCV 88.2 80.0 - 100.0 fL   MCH 28.1 26.0 - 34.0 pg   MCHC 31.9 30.0 - 36.0 g/dL   RDW 12.5 11.5 - 15.5 %   Platelets 317 150 - 400 K/uL   nRBC 0.0 0.0 - 0.2 %   Neutrophils Relative % 63 %   Neutro Abs 4.5 1.7 - 7.7 K/uL   Lymphocytes Relative 26 %   Lymphs Abs 1.8 0.7 - 4.0 K/uL   Monocytes Relative 9 %   Monocytes Absolute 0.7 0.1 - 1.0 K/uL   Eosinophils Relative 1 %   Eosinophils Absolute 0.0 0.0 - 0.5 K/uL   Basophils Relative 1 %   Basophils Absolute 0.0 0.0 - 0.1 K/uL   Immature Granulocytes 0 %   Abs Immature Granulocytes 0.02 0.00 - 0.07 K/uL   No results found.   Radiology No results found.  Procedures Procedures   Medications Ordered in ED Medications  lidocaine (LIDODERM) 5 % 1 patch (has no administration in time range)    ED Course  I have reviewed the triage vital signs and the nursing notes.  Pertinent labs & imaging results that were available during my care of the patient were reviewed by me and considered in my medical decision making (see chart for details).   no CP no SOB.  I suspect this is a baker's cyst.  I have ordered outpatient dvt study for am.  Stable for discharge with close follow up.   Shelley Higgins was evaluated in Emergency Department on 01/18/2021 for the symptoms described in the history of present illness. She was evaluated in the context of the global COVID-19 pandemic, which necessitated consideration that the patient might be at risk for infection with the SARS-CoV-2 virus that causes COVID-19. Institutional protocols and algorithms that pertain to the evaluation of patients at risk for COVID-19 are in a state of rapid change based on information released by regulatory bodies including the CDC and federal and state organizations. These policies and algorithms were followed during the patient's care in the ED.\    Final Clinical Impression(s) / ED Diagnoses Final diagnoses:  Right leg pain    Return for intractable  cough, coughing up blood, fevers >100.4 unrelieved by medication, shortness of breath, intractable vomiting, chest pain, shortness of breath, weakness, numbness, changes in speech, facial asymmetry, abdominal pain, passing out, Inability to tolerate liquids or food, cough, altered mental status or any concerns. No signs of systemic illness or infection. The patient is nontoxic-appearing on exam and vital signs are within normal limits.  I have reviewed the triage vital signs and the nursing notes. Pertinent labs & imaging results that were available during my care of the patient were reviewed by me and considered in my medical decision making (see chart for details). After history, exam, and medical workup I feel the patient has been appropriately medically screened and is safe for discharge home. Pertinent diagnoses were discussed with the patient. Patient was given return precautions.  Rx / DC Orders ED Discharge Orders         Ordered    [facility]        01/18/21 0054           Karishma Unrein, MD 01/18/21 7341

## 2021-01-18 NOTE — Progress Notes (Signed)
Right lower extremity venous duplex has been completed. Preliminary results can be found in CV Proc through chart review.   01/18/21 11:59 AM Shelley Higgins RVT

## 2021-02-28 ENCOUNTER — Ambulatory Visit (HOSPITAL_BASED_OUTPATIENT_CLINIC_OR_DEPARTMENT_OTHER): Payer: Medicaid Other | Admitting: Nurse Practitioner

## 2021-02-28 NOTE — Progress Notes (Deleted)
  Shelley Keeler, DNP, AGNP-c Primary Care Services ______________________________________________________________________________________________________________________________________________  HPI Shelley Higgins is a 40 y.o. female presenting to [facility] at [place] today to establish care.   Patient Care Team: Pcp, No as PCP - General Last CPE: *** Other providers seen: ***  Concerns today: . ***   Narrative: Shelley Higgins is {MARITAL STATUS:22092:::1} She has *** children. *** currently living at home. She reports she {IS SAFE:19720} in his current relationships and home environment.  She {Action; does/does not:19097} have a history or partner abuse.  She is currently {DESC; EMPLOYMENT STATUS:32210}.  She endorses {history; activity/diet:30389}.  She {Actions; denies/admits to:5300} nicotine use, {Actions; denies/reports/admits to:19208}{Drug Use:32241}, {Actions; denies/admits to:5300} {Alcohol:20592} She {Sexually Active/Partners:[phone]}  She reports {Regular/irregular menstrual period abdominal pain hpi md:30583} She {ACTION; IS/IS TGY:[id number]} planning pregnancy in the near future.  Contraceptive options include {contraception:315051} She reports STI history of {STDs:30427:::1} and {Actions; denies/reports/admits to:19208} concerns for STI today.  She {Actions; denies-reports:120008} recent changes to bowel habits, {Actions; denies-reports:120008} recent changes to bladder habits, {Actions; denies-reports:120008} recent changes to skin.  She {Actions; denies-reports:120008} recent mood related changes. PHQ and GAD listed below.  PHQ9 Today: Depression screen [facility] 2/9 12/02/2020 09/09/2020  Decreased Interest 0 0  Down, Depressed, Hopeless 0 0  PHQ - 2 Score 0 0   GAD7 Today: No flowsheet data found.  Health Maintenance Due  Topic Date Due  . COVID-19 Vaccine (1) Never done     PMH Past Medical History:   Diagnosis Date  . Anxiety    Phreesia 09/08/2020  . Dermoid cyst   . GERD (gastroesophageal reflux disease)    Phreesia 09/08/2020  . Pneumonia   . TB (tuberculosis)   . Tuberculosis    Phreesia 09/08/2020    ROS All review of systems negative except what is listed in the HPI  PHYSICAL EXAM {PE:3041131:a:"***"} {PHYSICAL EXAM WITH PROVIDER CHOICES:22563}  ASSESSMENT AND PLAN Problem List Items Addressed This Visit   None     Education provided today during visit and on AVS for patient to review at home.  Diet and Exercise recommendations provided.  Current diagnoses and recommendations discussed. HM recommendations reviewed with recommendations.    Outpatient Encounter Medications as of 02/28/2021  Medication Sig  . diclofenac Sodium (VOLTAREN) 1 % GEL Apply topically 4 (four) times daily.  . famotidine (PEPCID) 20 MG tablet Take 1 tablet (20 mg total) by mouth daily. (Patient not taking: Reported on 12/02/2020)  . rosuvastatin (CRESTOR) 10 MG tablet Take 1 tablet (10 mg total) by mouth daily.  . Vitamin D, Ergocalciferol, (DRISDOL) 1.25 MG (50000 UNIT) CAPS capsule Take 1 capsule (50,000 Units total) by mouth every 7 (seven) days.  . Vitamin D, Ergocalciferol, 50 MCG (2000 UT) CAPS Take by mouth.   No facility-administered encounter medications on file as of 02/28/2021.    No follow-ups on file.  Time: ***minutes, >50% spent counseling, care coordination, chart review, and documentation.   Orma Render, DNP, AGNP-c

## 2021-03-20 ENCOUNTER — Ambulatory Visit (INDEPENDENT_AMBULATORY_CARE_PROVIDER_SITE_OTHER): Payer: Medicaid Other | Admitting: Nurse Practitioner

## 2021-03-20 ENCOUNTER — Encounter (HOSPITAL_BASED_OUTPATIENT_CLINIC_OR_DEPARTMENT_OTHER): Payer: Self-pay | Admitting: Nurse Practitioner

## 2021-03-20 ENCOUNTER — Other Ambulatory Visit: Payer: Self-pay

## 2021-03-20 VITALS — BP 103/70 | HR 78 | Ht 65.0 in | Wt 192.8 lb

## 2021-03-20 DIAGNOSIS — R7303 Prediabetes: Secondary | ICD-10-CM | POA: Diagnosis not present

## 2021-03-20 DIAGNOSIS — E559 Vitamin D deficiency, unspecified: Secondary | ICD-10-CM

## 2021-03-20 DIAGNOSIS — Z0000 Encounter for general adult medical examination without abnormal findings: Secondary | ICD-10-CM | POA: Diagnosis not present

## 2021-03-20 DIAGNOSIS — E782 Mixed hyperlipidemia: Secondary | ICD-10-CM | POA: Diagnosis not present

## 2021-03-20 NOTE — Assessment & Plan Note (Addendum)
Review of current and past medical history, social history, medication, and family history.  Review of care gaps and health maintenance recommendations.  Records from recent providers to be requested if not available in Chart Review or Care Everywhere.  Recommendations for health maintenance, diet, and exercise provided.  UTD on HM Will obtain labs and fill out form for surgery- no indications present today of any reason patient could not undergo procedure.  Patient will return in 1 week for labs with nurse visit to make sure she is within the 6 week window. Needs- CBC, CMP, PT/INR, APTT, HIV, HCG serum qualitative, A1c, TSH with T3 and T4, and POCT UA completed.

## 2021-03-20 NOTE — Patient Instructions (Addendum)
Recommendations from today's visit: Come back in 7 days for labs and to pick paperwork.   Information on diet, exercise, and health maintenance recommendations are listed below. This is information to help you be sure you are on track for optimal health and monitoring.   Please look over this and let us know if you have any questions or if you have completed any of the health maintenance outside of [facility] so that we can be sure your records are up to date.  ___________________________________________________________  Thank you for choosing [place] at [facility] for your Primary Care needs. I am excited for the opportunity to partner with you to meet your health care goals. It was a pleasure meeting you today!  I am an Adult-Geriatric Nurse Practitioner with a background in caring for patients for more than 20 years. I received my Paediatric nurse in Nursing and my Doctor of Nursing Practice degrees at [employer]. I received additional fellowship training in primary care and sports medicine after receiving my doctorate degree. I provide primary care and sports medicine services to patients age 65 and older within this office. I am also a provider with the [facility] and the director of the APP Fellowship with [facility].  I am a [place] native, but have called the [place] area home for nearly 20 years and am proud to be a member of this community.   I am passionate about providing the best service to you through preventive medicine and supportive care. I consider you a part of the medical team and value your input. I work diligently to ensure that you are heard and your needs are met in a safe and effective manner. I want you to feel comfortable with me as your provider and want you to know that your health concerns are important to me.   For your information, our office hours are Monday- Friday 8:00 AM - 5:00 PM At this  time I am not in the office on Wednesdays.  If you have questions or concerns, please call our office at [phone] or send [place] a MyChart message and we will respond as quickly as possible.   For all urgent or time sensitive needs we ask that you please call the office to avoid delays. MyChart is not constantly monitored and replies may take up to 72 business hours.  MyChart Policy: MyChart allows for you to see your visit notes, after visit summary, provider recommendations, lab and tests results, make an appointment, request refills, and contact your provider or the office for non-urgent questions or concerns.  Providers are seeing patients during normal business hours and do not have built in time to review MyChart messages. We ask that you allow a minimum of 72 business hours for MyChart message responses.  Complex MyChart concerns may require a visit. Your provider may request you schedule a virtual or in person visit to ensure we are providing the best care possible. MyChart messages sent after 4:00 PM on Friday will not be received by the provider until Monday morning.    Lab and Test Results: You will receive your lab and test results on MyChart as soon as they are completed and results have been sent by the lab or testing facility. Due to this service, you will receive your results BEFORE your provider.  Please allow a minimum of 72 business hours for your provider to receive and review lab and test results and contact  you about.   Most lab and test result comments from the provider will be sent through [place]. Your provider may recommend changes to the plan of care, follow-up visits, repeat testing, ask questions, or request an office visit to discuss these results. You may reply directly to this message or call the office at [phone] to provide information for the provider or set up an appointment. In some instances, you will be called with test results and recommendations. Please  let us know if this is preferred and we will make note of this in your chart to provide this for you.    If you have not heard a response to your lab or test results in 72 business hours, please call the office to let us know.   After Hours: For all non-emergency after hours needs, please call the office at [phone] and select the option to reach the on-call provider service. On-call services are shared between multiple [place] offices and therefore it will not be possible to speak directly with your provider. On-call providers may provide medical advice and recommendations, but are unable to provide refills for maintenance medications.  For all emergency or urgent medical needs after normal business hours, we recommend that you seek care at the closest Urgent Care or Emergency Department to ensure appropriate treatment in a timely manner.  [facility] at [place] has a 24 hour emergency room located on the ground floor for your convenience.    Please do not hesitate to reach out to [place] with concerns.   Thank you, again, for choosing me as your health care partner. I appreciate your trust and look forward to learning more about you.   [name], DNP, AGNP-c ___________________________________________________________  Health Maintenance Recommendations Screening Testing Mammogram Every 1 -2 years based on history and risk factors Starting at age 7 Pap Smear Ages 21-39 every 3 years Ages 79-65 every 5 years with HPV testing More frequent testing may be required based on results and history Colon Cancer Screening Every 1-10 years based on test performed, risk factors, and history Starting at age 95 Bone Density Screening Every 2-10 years based on history Starting at age 42 for women Recommendations for men differ based on medication usage, history, and risk factors AAA Screening One time ultrasound Men 70-52 years old who have every smoked Lung Cancer  Screening Low Dose Lung CT every 12 months Age 42-80 years with a 30 pack-year smoking history who still smoke or who have quit within the last 15 years  Screening Labs Routine  Labs: Complete Blood Count (CBC), Complete Metabolic Panel (CMP), Cholesterol (Lipid Panel) Every 6-12 months based on history and medications May be recommended more frequently based on current conditions or previous results Hemoglobin A1c Lab Every 3-12 months based on history and previous results Starting at age 79 or earlier with diagnosis of diabetes, high cholesterol, BMI >26, and/or risk factors Frequent monitoring for patients with diabetes to ensure blood sugar control Thyroid Panel (TSH w/ T3 & T4) Every 6 months based on history, symptoms, and risk factors May be repeated more often if on medication HIV One time testing for all patients 28 and older May be repeated more frequently for patients with increased risk factors or exposure Hepatitis C One time testing for all patients 26 and older May be repeated more frequently for patients with increased risk factors or exposure Gonorrhea, Chlamydia Every 12 months for all sexually active persons 13-24 years Additional monitoring may be recommended for those  who are considered high risk or who have symptoms PSA Men 25-6 years old with risk factors Additional screening may be recommended from age 70-69 based on risk factors, symptoms, and history  Vaccine Recommendations Tetanus Booster All adults every 10 years Flu Vaccine All patients 6 months and older every year COVID Vaccine All patients 12 years and older Initial dosing with booster May recommend additional booster based on age and health history HPV Vaccine 2 doses all patients age 23-26 Dosing may be considered for patients over 26 Shingles Vaccine (Shingrix) 2 doses all adults 69 years and older Pneumonia (Pneumovax 23) All adults 75 years and older May recommend earlier dosing based  on health history Pneumonia (Prevnar 37) All adults 31 years and older Dosed 1 year after Pneumovax 23  Additional Screening, Testing, and Vaccinations may be recommended on an individualized basis based on family history, health history, risk factors, and/or exposure.  __________________________________________________________  Diet Recommendations for All Patients  I recommend that all patients maintain a diet low in saturated fats, carbohydrates, and cholesterol. While this can be challenging at first, it is not impossible and small changes can make big differences.  Things to try: Decreasing the amount of soda, sweet tea, and/or juice to one or less per day and replace with water While water is always the first choice, if you do not like water you may consider adding a water additive without sugar to improve the taste other sugar free drinks Replace potatoes with a brightly colored vegetable at dinner Use healthy oils, such as canola oil or olive oil, instead of butter or hard margarine Limit your bread intake to two pieces or less a day Replace regular pasta with low carb pasta options Bake, broil, or grill foods instead of frying Monitor portion sizes  Eat smaller, more frequent meals throughout the day instead of large meals  An important thing to remember is, if you love foods that are not great for your health, you don't have to give them up completely. Instead, allow these foods to be a reward when you have done well. Allowing yourself to still have special treats every once in a while is a nice way to tell yourself thank you for working hard to keep yourself healthy.   Also remember that every day is a new day. If you have a bad day and "fall off the wagon", you can still climb right back up and keep moving along on your journey!  We have resources available to help you!  Some websites that may be helpful  include: www.http://carter.biz/  Www.VeryWellFit.com _____________________________________________________________  Activity Recommendations for All Patients  I recommend that all adults get at least 20 minutes of moderate physical activity that elevates your heart rate at least 5 days out of the week.  Some examples include: Walking or jogging at a pace that allows you to carry on a conversation Cycling (stationary bike or outdoors) Water aerobics Yoga Weight lifting Dancing If physical limitations prevent you from putting stress on your joints, exercise in a pool or seated in a chair are excellent options.  Do determine your MAXIMUM heart rate for activity: YOUR AGE - 220 = MAX HeartRate   Remember! Do not push yourself too hard.  Start slowly and build up your pace, speed, weight, time in exercise, etc.  Allow your body to rest between exercise and get good sleep. You will need more water than normal when you are exerting yourself. Do not wait until you are thirsty  to drink. Drink with a purpose of getting in at least 8, 8 ounce glasses of water a day plus more depending on how much you exercise and sweat.    If you begin to develop dizziness, chest pain, abdominal pain, jaw pain, shortness of breath, headache, vision changes, lightheadedness, or other concerning symptoms, stop the activity and allow your body to rest. If your symptoms are severe, seek emergency evaluation immediately. If your symptoms are concerning, but not severe, please let us know so that we can recommend further evaluation.   ________________________________________________________________

## 2021-03-20 NOTE — Progress Notes (Signed)
Shelley Keeler, DNP, AGNP-c Primary Care Services ______________________________________________________________________  HPI Shelley Higgins is a 40 y.o. female presenting to [facility] at [place] today to establish care.   Patient Care Team: Pcp, No as PCP - General  Health Maintenance  Topic Date Due   COVID-19 Vaccine (1) Never done   Flu Shot  04/28/2021   Pap Smear  10/08/2023   Tetanus Vaccine  09/09/2030   Hepatitis C Screening: USPSTF Recommendation to screen - Ages 18-79 yo.  Completed   HIV Screening  Completed   Pneumococcal Vaccination  Aged Out   HPV Vaccine  Aged Out     Concerns today: Exam and labs for surgery August 11. Reports she has quit smoking and is working on diet and exercise. Endorses overall good health.  She denies any specific concerns of health issues today.  She is UTD on health maintenance.    Patient Active Problem List   Diagnosis Date Noted   Vitamin D deficiency 09/10/2020   Hyperlipidemia 09/10/2020   Pre-diabetes 09/10/2020   GERD (gastroesophageal reflux disease) 09/09/2020   Ovarian cyst 08/13/2014    PHQ9 Today: Depression screen [facility] 2/9 12/02/2020 09/09/2020  Decreased Interest 0 0  Down, Depressed, Hopeless 0 0  PHQ - 2 Score 0 0   GAD7 Today: No flowsheet data found. ______________________________________________________________________ PMH Past Medical History:  Diagnosis Date   Anxiety    Phreesia 09/08/2020   Dermoid cyst    GERD (gastroesophageal reflux disease)    Phreesia 09/08/2020   Pneumonia    TB (tuberculosis)    Tuberculosis    Phreesia 09/08/2020    ROS All review of systems negative except what is listed in the HPI  PHYSICAL EXAM Physical Exam Vitals and nursing note reviewed.  Constitutional:      Appearance: Normal appearance. She is obese.  HENT:     Head: Normocephalic and atraumatic.     Right Ear: Tympanic membrane, ear canal and  external ear normal.     Left Ear: Tympanic membrane, ear canal and external ear normal.     Nose: Nose normal.     Mouth/Throat:     Mouth: Mucous membranes are moist.     Pharynx: Oropharynx is clear.  Eyes:     Extraocular Movements: Extraocular movements intact.     Conjunctiva/sclera: Conjunctivae normal.     Pupils: Pupils are equal, round, and reactive to light.  Neck:     Vascular: No carotid bruit.  Cardiovascular:     Rate and Rhythm: Normal rate and regular rhythm.     Pulses: Normal pulses.     Heart sounds: Normal heart sounds.  Pulmonary:     Effort: Pulmonary effort is normal.     Breath sounds: Normal breath sounds.  Abdominal:     General: Abdomen is flat. Bowel sounds are normal.     Palpations: Abdomen is soft.  Musculoskeletal:        General: Normal range of motion.     Cervical back: Normal range of motion and neck supple. No tenderness.     Right lower leg: No edema.     Left lower leg: No edema.  Lymphadenopathy:     Cervical: No cervical adenopathy.  Skin:    General: Skin is warm and dry.     Capillary Refill: Capillary refill takes less than 2 seconds.  Neurological:     General: No focal deficit present.     Mental Status: She is  alert and oriented to person, place, and time.  Psychiatric:        Mood and Affect: Mood normal.        Behavior: Behavior normal.        Thought Content: Thought content normal.        Judgment: Judgment normal.   ______________________________________________________________________ ASSESSMENT AND PLAN Problem List Items Addressed This Visit   None   Education provided today during visit and on AVS for patient to review at home.  Diet and Exercise recommendations provided.  Current diagnoses and recommendations discussed. HM recommendations reviewed with recommendations.    Outpatient Encounter Medications as of 03/20/2021  Medication Sig   diclofenac Sodium (VOLTAREN) 1 % GEL Apply topically 4 (four) times  daily.   famotidine (PEPCID) 20 MG tablet Take 1 tablet (20 mg total) by mouth daily. (Patient not taking: Reported on 12/02/2020)   rosuvastatin (CRESTOR) 10 MG tablet Take 1 tablet (10 mg total) by mouth daily.   Vitamin D, Ergocalciferol, (DRISDOL) 1.25 MG (50000 UNIT) CAPS capsule Take 1 capsule (50,000 Units total) by mouth every 7 (seven) days.   Vitamin D, Ergocalciferol, 50 MCG (2000 UT) CAPS Take by mouth.   No facility-administered encounter medications on file as of 03/20/2021.    No follow-ups on file.  Time: 35 minutes, >50% spent counseling, care coordination, chart review, and documentation.   Orma Render, DNP, AGNP-c

## 2021-03-27 ENCOUNTER — Ambulatory Visit (HOSPITAL_BASED_OUTPATIENT_CLINIC_OR_DEPARTMENT_OTHER): Payer: Medicaid Other

## 2021-03-27 ENCOUNTER — Ambulatory Visit (INDEPENDENT_AMBULATORY_CARE_PROVIDER_SITE_OTHER): Payer: Medicaid Other | Admitting: Nurse Practitioner

## 2021-03-27 ENCOUNTER — Other Ambulatory Visit: Payer: Self-pay

## 2021-03-27 DIAGNOSIS — Z01811 Encounter for preprocedural respiratory examination: Secondary | ICD-10-CM

## 2021-03-27 DIAGNOSIS — Z1329 Encounter for screening for other suspected endocrine disorder: Secondary | ICD-10-CM | POA: Diagnosis not present

## 2021-03-27 DIAGNOSIS — Z01810 Encounter for preprocedural cardiovascular examination: Secondary | ICD-10-CM

## 2021-03-27 DIAGNOSIS — Z130 Encounter for screening for diseases of the blood and blood-forming organs and certain disorders involving the immune mechanism: Secondary | ICD-10-CM | POA: Diagnosis not present

## 2021-03-27 DIAGNOSIS — Z01818 Encounter for other preprocedural examination: Secondary | ICD-10-CM

## 2021-04-01 LAB — CBC WITH DIFFERENTIAL
Basophils Absolute: 0 10*3/uL (ref 0.0–0.2)
Basos: 1 %
EOS (ABSOLUTE): 0 10*3/uL (ref 0.0–0.4)
Eos: 1 %
Hematocrit: 39.2 % (ref 34.0–46.6)
Hemoglobin: 12.6 g/dL (ref 11.1–15.9)
Immature Grans (Abs): 0 10*3/uL (ref 0.0–0.1)
Immature Granulocytes: 0 %
Lymphocytes Absolute: 1.8 10*3/uL (ref 0.7–3.1)
Lymphs: 28 %
MCH: 27.5 pg (ref 26.6–33.0)
MCHC: 32.1 g/dL (ref 31.5–35.7)
MCV: 86 fL (ref 79–97)
Monocytes Absolute: 0.5 10*3/uL (ref 0.1–0.9)
Monocytes: 8 %
Neutrophils Absolute: 4 10*3/uL (ref 1.4–7.0)
Neutrophils: 62 %
RBC: 4.58 x10E6/uL (ref 3.77–5.28)
RDW: 12.8 % (ref 11.7–15.4)
WBC: 6.5 10*3/uL (ref 3.4–10.8)

## 2021-04-01 LAB — COMPREHENSIVE METABOLIC PANEL
ALT: 20 IU/L (ref 0–32)
AST: 20 IU/L (ref 0–40)
Albumin/Globulin Ratio: 1.8 (ref 1.2–2.2)
Albumin: 4.8 g/dL (ref 3.8–4.8)
Alkaline Phosphatase: 52 IU/L (ref 44–121)
BUN/Creatinine Ratio: 15 (ref 9–23)
BUN: 14 mg/dL (ref 6–20)
Bilirubin Total: 0.3 mg/dL (ref 0.0–1.2)
CO2: 24 mmol/L (ref 20–29)
Calcium: 9.4 mg/dL (ref 8.7–10.2)
Chloride: 99 mmol/L (ref 96–106)
Creatinine, Ser: 0.92 mg/dL (ref 0.57–1.00)
Globulin, Total: 2.6 g/dL (ref 1.5–4.5)
Glucose: 95 mg/dL (ref 65–99)
Potassium: 4.2 mmol/L (ref 3.5–5.2)
Sodium: 135 mmol/L (ref 134–144)
Total Protein: 7.4 g/dL (ref 6.0–8.5)
eGFR: 82 mL/min/{1.73_m2} (ref >59)

## 2021-04-01 LAB — HIV ANTIBODY (ROUTINE TESTING W REFLEX): HIV Screen 4th Generation wRfx: NONREACTIVE

## 2021-04-01 LAB — PT AND PTT
INR: 1 (ref 0.9–1.2)
Prothrombin Time: 10.2 s (ref 9.1–12.0)
aPTT: 29 s (ref 24–33)

## 2021-04-01 LAB — THYROID PANEL WITH TSH
Free Thyroxine Index: 1.7 (ref 1.2–4.9)
T3 Uptake Ratio: 24 % (ref 24–39)
T4, Total: 7 ug/dL (ref 4.5–12.0)
TSH: 2.59 u[IU]/mL (ref 0.450–4.500)

## 2021-04-01 LAB — PTT FACTOR INHIBITOR (MIXING STUDY): aPTT: 25.1 s (ref 22.9–30.2)

## 2021-04-01 LAB — HCG, SERUM, QUALITATIVE: hCG,Beta Subunit,Qual,Serum: NEGATIVE m[IU]/mL (ref <6)

## 2021-04-07 ENCOUNTER — Telehealth (HOSPITAL_BASED_OUTPATIENT_CLINIC_OR_DEPARTMENT_OTHER): Payer: Self-pay

## 2021-04-07 NOTE — Progress Notes (Signed)
Please call patient:  Her labs have all resulted and look great! I will sign off on the surgery form and send it to [facility].   Please have her come in for a hemoglobin A1c as a nurse visit at her earliest convenience- this is a required test and was not run at her visit. It looks like the order is still in from her visit, but if needed to readd- POC A1c for dx: screening for diabetes.   Please print the labs and fax with paperwork to the [facility] office. Paperwork completed and placed in top basket.

## 2021-04-07 NOTE — Telephone Encounter (Signed)
Results released by [name], AGNP and called patient to discuss lab results.  Patient is aware and understands.  She scheduled a nurse visit on 04/09/21 @ 9:20 am for an A1C.  Instructed patient to contact the office with any questions or concerns.

## 2021-04-07 NOTE — Telephone Encounter (Signed)
-----   Message from Orma Render, NP sent at 04/07/2021  7:52 AM EDT ----- Please call patient:  Her labs have all resulted and look great! I will sign off on the surgery form and send it to [facility].   Please have her come in for a hemoglobin A1c as a nurse visit at her earliest convenience- this is a required test and was not run at her visit. It looks like the order is still in from her visit, but if needed to readd- POC A1c for dx: screening for diabetes.   Please print the labs and fax with paperwork to the [facility] office. Paperwork completed and placed in top basket.

## 2021-04-09 ENCOUNTER — Ambulatory Visit (HOSPITAL_BASED_OUTPATIENT_CLINIC_OR_DEPARTMENT_OTHER): Payer: Medicaid Other | Admitting: Nurse Practitioner

## 2021-04-09 ENCOUNTER — Other Ambulatory Visit: Payer: Self-pay

## 2021-04-09 DIAGNOSIS — R7303 Prediabetes: Secondary | ICD-10-CM

## 2021-04-09 LAB — POCT GLYCOSYLATED HEMOGLOBIN (HGB A1C): HbA1c POC (<> result, manual entry): 5.3 % (ref 4.0–5.6)

## 2021-04-11 NOTE — Progress Notes (Signed)
A1c 5.3%. Did she need this sent to the surgeon?

## 2021-04-14 ENCOUNTER — Telehealth (HOSPITAL_BASED_OUTPATIENT_CLINIC_OR_DEPARTMENT_OTHER): Payer: Self-pay

## 2021-04-14 NOTE — Telephone Encounter (Signed)
Results released by [name], AGNP.  Called patient to discuss lab result.  A1C result faxed to surgeon.  Instructed patient to contact the office with any questions or concerns.

## 2021-04-14 NOTE — Telephone Encounter (Signed)
-----   Message from Orma Render, NP sent at 04/11/2021 10:57 AM EDT ----- A1c 5.3%. Did she need this sent to the surgeon?

## 2021-09-27 ENCOUNTER — Ambulatory Visit (HOSPITAL_COMMUNITY)
Admission: EM | Admit: 2021-09-27 | Discharge: 2021-09-27 | Disposition: A | Discharge status: Home / Self Care | Payer: Medicaid Other | Attending: Emergency Medicine | Admitting: Emergency Medicine

## 2021-09-27 ENCOUNTER — Encounter (HOSPITAL_COMMUNITY): Payer: Self-pay | Admitting: Emergency Medicine

## 2021-09-27 ENCOUNTER — Ambulatory Visit (INDEPENDENT_AMBULATORY_CARE_PROVIDER_SITE_OTHER): Payer: Medicaid Other

## 2021-09-27 ENCOUNTER — Other Ambulatory Visit: Payer: Self-pay

## 2021-09-27 DIAGNOSIS — R519 Headache, unspecified: Secondary | ICD-10-CM | POA: Diagnosis not present

## 2021-09-27 DIAGNOSIS — M25551 Pain in right hip: Secondary | ICD-10-CM

## 2021-09-27 DIAGNOSIS — G8929 Other chronic pain: Secondary | ICD-10-CM

## 2021-09-27 DIAGNOSIS — T1490XA Injury, unspecified, initial encounter: Secondary | ICD-10-CM | POA: Diagnosis not present

## 2021-09-27 MED ORDER — PREDNISONE 20 MG PO TABS: 40.0000 mg | ORAL_TABLET | Freq: Every day | ORAL | 0 refills | Status: DC

## 2021-09-27 MED ORDER — CYCLOBENZAPRINE HCL 10 MG PO TABS: 10.0000 mg | ORAL_TABLET | Freq: Every day | ORAL | 0 refills | Status: DC

## 2021-09-27 NOTE — Discharge Instructions (Signed)
Your x-ray of your hip was negative for injury, therefore your symptoms are most likely a result of irritation to the soft tissues in the muscles  Take prednisone every morning with food for the next 5 days, this medication will help calm any inflammation and irritation that is present, and will also help reduce the discomfort with your headache  While you are using prednisone you may use Tylenol throughout the day for additional comfort taking 500 to 1000 mg every 6 hours as needed  You may use muscle relaxer at bedtime for additional relief  For your help you may use pillows for support, heat in 15-minute intervals, activity as tolerated You may follow-up with orthopedic specialist for reoccurring or persistent symptoms, information is listed below  For your head try to rest today in a calm quiet environment, and ensure that you are hydrated through use of water, may follow-up with your primary doctor for persistent or reoccurring symptoms, at any point if your headache becomes severe headache that you have ever felt please go to the nearest emergency department for evaluation

## 2021-09-27 NOTE — ED Triage Notes (Signed)
Headache for 4 days.  Covid test at home was negative.  Pain started behind left eye and now radiating throughout head.  At the same time started having right hip pain .  Reports hearing a cracking noise with specific movements and pain from anterior to posterior/buttocks with movement.  Patient reports a history of aching hip.    Patient noticed this pain after having sex 2 days prior to onset of pain

## 2021-09-27 NOTE — ED Provider Notes (Signed)
[facility]    CSN: [id number] Arrival date & time: 09/27/21  1002      History   Chief Complaint Chief Complaint  Patient presents with   Headache    HPI Shelley Higgins is a 40 y.o. female.   Patient presents with constant generalized headache for 4 days.  Rating to 6 out of 10.  Described as pressure and discomfort.  Symptoms initially started over right eye and move to the remainder.  Has attempted use of over-the-counter pain medications with no relief.  Denies photophobia, phonophobia, blurred vision, floaters.   Patien concerned with flareup of chronic right hip pain.  Endorses that she was having intercourse when she heard a popping sound and immediately began to have pain.  Endorses intermittent sharp shooting pain into buttocks.  Able to bear weight.  Range of motion intact but does elicit pain with external rotation.  Denies numbness, tingling, prior injury or trauma.  Endorses that the headache started at the same time that hip pain did.  Past Medical History:  Diagnosis Date   Anxiety    Phreesia 09/08/2020   Dermoid cyst    GERD (gastroesophageal reflux disease)    Phreesia 09/08/2020   Hyperlipidemia    Pneumonia    TB (tuberculosis)    Tuberculosis    Phreesia 09/08/2020    Patient Active Problem List   Diagnosis Date Noted   Encounter for medical examination to establish care 03/20/2021   Vitamin D deficiency 09/10/2020   Hyperlipidemia 09/10/2020   Pre-diabetes 09/10/2020   GERD (gastroesophageal reflux disease) 09/09/2020   Ovarian cyst 08/13/2014    Past Surgical History:  Procedure Laterality Date   INDUCED ABORTION     WISDOM TOOTH EXTRACTION      OB History     Gravida  3   Para  2   Term  2   Preterm      AB  1   Living  2      SAB      IAB  1   Ectopic      Multiple      Live Births               Home Medications    Prior to Admission medications   Medication Sig Start Date End Date Taking?  Authorizing Provider  diclofenac Sodium (VOLTAREN) 1 % GEL Apply topically 4 (four) times daily. Patient not taking: Reported on 09/27/2021    [provider]  famotidine (PEPCID) 20 MG tablet Take 1 tablet (20 mg total) by mouth daily. Patient not taking: Reported on 09/27/2021 10/07/20   Just, Laurita Quint, FNP  rosuvastatin (CRESTOR) 10 MG tablet Take 1 tablet (10 mg total) by mouth daily. Patient not taking: Reported on 09/27/2021 09/10/20   Just, Laurita Quint, FNP  Vitamin D, Ergocalciferol, (DRISDOL) 1.25 MG (50000 UNIT) CAPS capsule Take 1 capsule (50,000 Units total) by mouth every 7 (seven) days. 09/10/20   Just, Laurita Quint, FNP  Vitamin D, Ergocalciferol, 50 MCG (2000 UT) CAPS Take by mouth.    [provider]    Family History Family History  Problem Relation Age of Onset   Stroke Mother    Hypertension Mother    Diabetes Mother    Miscarriages / [place] Son     Social History Social History   Tobacco Use   Smoking status: Former    Years: 20.00    Types: Cigarettes    Quit date: 06/04/2020  Years since quitting: 1.3   Smokeless tobacco: Never  Vaping Use   Vaping Use: Never used  Substance Use Topics   Alcohol use: Yes    Alcohol/week: 6.0 standard drinks    Types: 3 Glasses of wine, 3 Shots of liquor per week    Comment: socially   Drug use: No     Allergies   Patient has no known allergies.   Review of Systems Review of Systems  Neurological:  Positive for headaches.    Physical Exam Triage Vital Signs ED Triage Vitals  Enc Vitals Group     BP 09/27/21 1024 109/72     Pulse Rate 09/27/21 1024 64     Resp 09/27/21 1024 20     Temp 09/27/21 1024 98.7 F (37.1 C)     Temp Source 09/27/21 1024 Oral     SpO2 09/27/21 1024 100 %     Weight --      Height --      Head Circumference --      Peak Flow --      Pain Score 09/27/21 1021 6     Pain Loc --      Pain Edu? --      Excl. in [place]? --    No data found.  Updated Vital  Signs BP 109/72 (BP Location: Right Arm) Comment (BP Location): large cuff   Pulse 64    Temp 98.7 F (37.1 C) (Oral)    Resp 20    LMP 09/14/2021 (Approximate)    SpO2 100%   Visual Acuity Right Eye Distance:   Left Eye Distance:   Bilateral Distance:    Right Eye Near:   Left Eye Near:    Bilateral Near:     Physical Exam   UC Treatments / Results  Labs (all labs ordered are listed, but only abnormal results are displayed) Labs Reviewed - No data to display  EKG   Radiology No results found.  Procedures Procedures (including critical care time)  Medications Ordered in UC Medications - No data to display  Initial Impression / Assessment and Plan / UC Course  I have reviewed the triage vital signs and the nursing notes.  Pertinent labs & imaging results that were available during my care of the patient were reviewed by me and considered in my medical decision making (see chart for details).  Clinical Course as of 09/30/21 1548  Sat Sep 27, 2021  1121 DG Hip Unilat With Pelvis 2-3 Views Right [AW]    Clinical Course User Index [AW] Lowella Petties R, NP    Chronic right hip pain Bad headache  1.  X-ray right hip negative, discussed findings with patient, declined intervention while in office, prednisone 40 mg daily for 5 days prescribed, this will help to manage headache as well, Flexeril 10 mg at bedtime for additional comfort, RICE recommended, over-the-counter ibuprofen or Tylenol for further management of headaches, follow-up with PCP or orthopedic specialist in 1 to 2 weeks for persistent or reoccurring symptoms Final Clinical Impressions(s) / UC Diagnoses   Final diagnoses:  None   Discharge Instructions   None    ED Prescriptions   None    PDMP not reviewed this encounter.   Hans Eden, NP 09/30/21 1549

## 2021-12-04 ENCOUNTER — Ambulatory Visit (HOSPITAL_BASED_OUTPATIENT_CLINIC_OR_DEPARTMENT_OTHER): Payer: Medicaid Other | Admitting: Nurse Practitioner

## 2021-12-19 ENCOUNTER — Ambulatory Visit (INDEPENDENT_AMBULATORY_CARE_PROVIDER_SITE_OTHER): Payer: Medicaid Other | Admitting: Nurse Practitioner

## 2021-12-19 ENCOUNTER — Ambulatory Visit
Admission: RE | Admit: 2021-12-19 | Discharge: 2021-12-19 | Disposition: A | Discharge status: Home / Self Care | Payer: Medicaid Other | Source: Ambulatory Visit | Attending: Nurse Practitioner | Admitting: Nurse Practitioner

## 2021-12-19 ENCOUNTER — Other Ambulatory Visit: Payer: Self-pay

## 2021-12-19 ENCOUNTER — Other Ambulatory Visit (HOSPITAL_BASED_OUTPATIENT_CLINIC_OR_DEPARTMENT_OTHER): Payer: Self-pay

## 2021-12-19 ENCOUNTER — Encounter (HOSPITAL_BASED_OUTPATIENT_CLINIC_OR_DEPARTMENT_OTHER): Payer: Self-pay | Admitting: Nurse Practitioner

## 2021-12-19 DIAGNOSIS — R7611 Nonspecific reaction to tuberculin skin test without active tuberculosis: Secondary | ICD-10-CM | POA: Diagnosis not present

## 2021-12-19 DIAGNOSIS — Z111 Encounter for screening for respiratory tuberculosis: Secondary | ICD-10-CM

## 2021-12-19 DIAGNOSIS — E7849 Other hyperlipidemia: Secondary | ICD-10-CM

## 2021-12-19 DIAGNOSIS — E559 Vitamin D deficiency, unspecified: Secondary | ICD-10-CM

## 2021-12-19 MED ORDER — ROSUVASTATIN CALCIUM 10 MG PO TABS: 10.0000 mg | ORAL_TABLET | Freq: Every day | ORAL | 3 refills | Status: DC

## 2021-12-19 MED ORDER — VITAMIN D (ERGOCALCIFEROL) 50 MCG (2000 UT) PO CAPS: 1.0000 | ORAL_CAPSULE | Freq: Once | ORAL | 0 refills | Status: AC

## 2021-12-19 NOTE — Progress Notes (Signed)
? ?  Established Patient Office Visit ? ?Subjective:  ?Patient ID: Shelley Higgins, female    DOB: 10-18-1980  Age: 41 y.o. MRN: [id number] ? ?CC: No chief complaint on file. ? ? ?HPI ?Shelley Higgins presents for TB screening for employment. PPD testing returns positive for patient due to hx of TB and she must have CXR to r/o for her new job. No symptoms present at this time. No concerns.  ? ?ROS ?Review of Systems ?All review of systems negative except what is listed in the HPI ? ?  ?Objective:  ?  ?Physical Exam ?Constitutional:   ?   Appearance: Normal appearance. She is normal weight.  ?Pulmonary:  ?   Effort: Pulmonary effort is normal.  ?Neurological:  ?   General: No focal deficit present.  ?   Mental Status: She is alert and oriented to person, place, and time.  ?Psychiatric:     ?   Mood and Affect: Mood normal.     ?   Behavior: Behavior normal.     ?   Thought Content: Thought content normal.     ?   Judgment: Judgment normal.  ? ? ?There were no vitals taken for this visit. ?Wt Readings from Last 3 Encounters:  ?03/20/21 192 lb 12.8 oz (87.5 kg)  ?01/17/21 195 lb (88.5 kg)  ?12/02/20 195 lb (88.5 kg)  ?  ?Assessment & Plan:  ? ?Problem List Items Addressed This Visit   ?None ?Visit Diagnoses   ? ? Screening-pulmonary TB    -  Primary  ? Relevant Orders  ? DG Chest 2 View  ? ?  ? ?Screening chest x-ray for tb. Orders placed today. No alarm symptoms present.  ? ?No orders of the defined types were placed in this encounter. ? ? ?Follow-up: Return if symptoms worsen or fail to improve.  ? ? ?Orma Render, NP ?

## 2021-12-19 NOTE — Patient Instructions (Signed)
Your physician recommends that you have an XRAY of your chest. X-rays are a type of radiation called electromagnetic waves. X-ray imaging creates pictures of the inside of your body. The images show the parts of your body in different shades of black and white. This is because different tissues absorb different amounts of radiation. Calcium in bones absorbs x-rays the most, so bones look white. Fat and other soft tissues absorb less and look gray. Air absorbs the least, so lungs look black ? ?You may have this done at the [facility], located in the [place] on the 1st floor. ?You do no have to have an appointment. ?       [address] ?       [place], [place] [address] ?       [phone] ?       Monday - Friday  8:00 am - 5:00 pm ? ?[facility] ? ? ? ?

## 2022-04-24 ENCOUNTER — Encounter (HOSPITAL_COMMUNITY): Payer: Self-pay | Admitting: Emergency Medicine

## 2022-04-24 ENCOUNTER — Other Ambulatory Visit: Payer: Self-pay

## 2022-04-24 ENCOUNTER — Emergency Department (HOSPITAL_COMMUNITY)
Admission: EM | Admit: 2022-04-24 | Discharge: 2022-04-24 | Disposition: A | Discharge status: Home / Self Care | Payer: Medicaid Other | Attending: Emergency Medicine | Admitting: Emergency Medicine

## 2022-04-24 DIAGNOSIS — H5712 Ocular pain, left eye: Secondary | ICD-10-CM | POA: Diagnosis present

## 2022-04-24 DIAGNOSIS — H00014 Hordeolum externum left upper eyelid: Secondary | ICD-10-CM | POA: Insufficient documentation

## 2022-04-24 MED ORDER — ERYTHROMYCIN 5 MG/GM OP OINT
1.0000 | TOPICAL_OINTMENT | Freq: Once | OPHTHALMIC | Status: AC
  Administered 2022-04-24: 1 via OPHTHALMIC
  Filled 2022-04-24: qty 3.5

## 2022-04-24 MED ORDER — TETRACAINE HCL 0.5 % OP SOLN
2.0000 [drp] | Freq: Once | OPHTHALMIC | Status: AC
  Administered 2022-04-24: 2 [drp] via OPHTHALMIC
  Filled 2022-04-24: qty 4

## 2022-04-24 MED ORDER — FLUORESCEIN SODIUM 1 MG OP STRP
1.0000 | ORAL_STRIP | Freq: Once | OPHTHALMIC | Status: AC
  Administered 2022-04-24: 1 via OPHTHALMIC
  Filled 2022-04-24: qty 1

## 2022-04-24 NOTE — ED Triage Notes (Signed)
Patient here with complaint of left eye pain, described as a pressure, that was present on waking this morning. Denies vision changes, reports pain is focused on lateral corner of eye and is exacerbated by palpation. Denies drainage.

## 2022-04-24 NOTE — Discharge Instructions (Signed)
Please read and follow all provided instructions.  Your diagnoses today include:  1. Pain of left eye     Tests performed today include: Visual acuity testing to check your vision Fluorescein dye examination to look for scratches on your eye Tonometry to check the pressure inside of your eye Vital signs. See below for your results today.   Medications prescribed:  Erythromycin  - antibiotic eye ointment  Use this medication as follows: Apply 1/4" of the antibiotic ointment to affected eye up to 6 times a day while awake for 7 days  Take any prescribed medications only as directed.  Home care instructions:  Follow any educational materials contained in this packet. If you wear contact lenses, do not use them until your eye caregiver approves. Follow-up care is necessary to be sure the infection is healing if not completely resolved in 2-3 days. See your caregiver or eye specialist as suggested for followup.   If you have an eye infection, wash your hands often as this is very contagious and is easily spread from person to person.   Follow-up instructions: Please follow-up with your primary care doctor OR the opthalmologist listed in the next 2-3 days for further evaluation of your symptoms.  Return instructions:  Please return to the Emergency Department if you experience worsening symptoms.  Please return immediately if you develop severe pain, pus drainage, new change in vision, or fever. Please return if you have any other emergent concerns.  Additional Information:  Your vital signs today were: BP 121/73 (BP Location: Right Arm)   Pulse 75   Temp 98.5 F (36.9 C) (Oral)   Resp 16   SpO2 100%  If your blood pressure (BP) was elevated above 135/85 this visit, please have this repeated by your doctor within one month. ---------------

## 2022-04-24 NOTE — ED Provider Notes (Signed)
[facility] EMERGENCY DEPARTMENT Provider Note   CSN: [id number] Arrival date & time: 04/24/22  0734     History  Chief Complaint  Patient presents with   Eye Pain    Shelley Higgins is a 41 y.o. female.  She presents to the emergency department with concern with of left lateral eye pain. Pain started yesterday.  She reports the pain is worse when she closes her eye, she rates the pain/pressure as 7/10 on the pain scale when closed. She states she did have a headache yesterday but not currently, and noticed some "twitching" of her right eye yesterday. She reports no changes in her vision, no double vision. She denies drainage. She does not wear contacts but does wear fake lashes, she removed them yesterday. Denies any foreign bodies into the eye.        Home Medications Prior to Admission medications   Medication Sig Start Date End Date Taking? Authorizing Provider  cyclobenzaprine (FLEXERIL) 10 MG tablet Take 1 tablet (10 mg total) by mouth at bedtime. 09/27/21   Hans Eden, NP  famotidine (PEPCID) 20 MG tablet Take 1 tablet (20 mg total) by mouth daily. Patient not taking: Reported on 09/27/2021 10/07/20   Just, Laurita Quint, FNP  predniSONE (DELTASONE) 20 MG tablet Take 2 tablets (40 mg total) by mouth daily. 09/27/21   White, Leitha Schuller, NP  rosuvastatin (CRESTOR) 10 MG tablet Take 1 tablet (10 mg total) by mouth daily. 12/19/21   Orma Render, NP  Vitamin D, Ergocalciferol, (DRISDOL) 1.25 MG (50000 UNIT) CAPS capsule Take 1 capsule (50,000 Units total) by mouth every 7 (seven) days. 09/10/20   Just, Laurita Quint, FNP      Allergies    Patient has no known allergies.    Review of Systems   Review of Systems  Physical Exam Updated Vital Signs BP 121/73 (BP Location: Right Arm)   Pulse 75   Temp 98.5 F (36.9 C) (Oral)   Resp 16   SpO2 100%   Physical Exam Vitals and nursing note reviewed.  Constitutional:      General: She is not in acute  distress.    Appearance: She is well-developed.  HENT:     Head: Normocephalic and atraumatic.     Right Ear: External ear normal.     Left Ear: External ear normal.     Nose: Nose normal.     Mouth/Throat:     Mouth: Mucous membranes are moist.  Eyes:     General: Lids are everted, no foreign bodies appreciated.        Left eye: Hordeolum (lateral, upper eyelid, mild) present.    Intraocular pressure: Left eye pressure is 28 mmHg. Measurements were taken using a handheld tonometer.    Extraocular Movements:     Right eye: Normal extraocular motion.     Left eye: Normal extraocular motion.     Conjunctiva/sclera: Conjunctivae normal.     Right eye: Right conjunctiva is not injected. No chemosis or exudate.    Left eye: Left conjunctiva is not injected. No chemosis or exudate.    Pupils: Pupils are equal, round, and reactive to light.     Right eye: Pupil is round, reactive and not sluggish.     Left eye: Pupil is round, reactive and not sluggish. No fluorescein uptake.     Comments: Mild edema left lateral upper eyelid. Suspect early hordeolum. Globe appears normal.   Cardiovascular:     Rate and  Rhythm: Normal rate and regular rhythm.     Heart sounds: No murmur heard. Pulmonary:     Effort: No respiratory distress.     Breath sounds: No wheezing, rhonchi or rales.  Abdominal:     Palpations: Abdomen is soft.     Tenderness: There is no abdominal tenderness. There is no guarding or rebound.  Musculoskeletal:     Cervical back: Normal range of motion and neck supple.     Right lower leg: No edema.     Left lower leg: No edema.  Skin:    General: Skin is warm and dry.     Findings: No rash.  Neurological:     General: No focal deficit present.     Mental Status: She is alert. Mental status is at baseline.     Motor: No weakness.  Psychiatric:        Mood and Affect: Mood normal.     ED Results / Procedures / Treatments   Labs (all labs ordered are listed, but only  abnormal results are displayed) Labs Reviewed - No data to display  EKG None  Radiology No results found.  Procedures Procedures    Medications Ordered in ED Medications  fluorescein ophthalmic strip 1 strip (has no administration in time range)  tetracaine (PONTOCAINE) 0.5 % ophthalmic solution 2 drop (has no administration in time range)    ED Course/ Medical Decision Making/ A&P    Patient seen and examined. History obtained directly from patient.   Labs/EKG: None ordered  Imaging: None ordered  Medications/Fluids: Erythromycin ointment    Visual Acuity  Right Eye Distance:   Left Eye Distance:   Bilateral Distance: 20  Right Eye Near:   Left Eye Near:    Bilateral Near:  20  Exam performed. Patient tolerated with difficulty, especially tonometer due to anxiety with providers working around her eye.   Most recent vital signs reviewed and are as follows: BP 121/73 (BP Location: Right Arm)   Pulse 75   Temp 98.5 F (36.9 C) (Oral)   Resp 16   SpO2 100%   Initial impression: L eye pain, likely from left eye lid, suspect early hordeolum  Home treatment plan: Erythromycin ointment, warm compresses  Return instructions discussed with patient: Return with worsening pain, swelling, drainage, especially if swelling is around the eye or she develops any vision changes  Follow-up instructions discussed with patient: With PCP or ophthalmology referral in 2 to 3 days for recheck if not improving                          Medical Decision Making Risk Prescription drug management.   Patient with left eyelid pain.  Suspect early hordeolum.  No foreign bodies noted. No surrounding erythema, swelling, vision changes/loss suspicious for orbital or periorbital cellulitis. No signs of iritis. Low concern for glaucoma. No symptoms of retinal detachment. No ophthalmologic emergency suspected. Outpatient referral given in case of no improvement.          Final  Clinical Impression(s) / ED Diagnoses Final diagnoses:  Pain of left eye    Rx / DC Orders ED Discharge Orders     None         Carlisle Cater, PA-C 04/24/22 0913    Wyvonnia Dusky, MD 04/24/22 [phone]

## 2022-07-06 ENCOUNTER — Ambulatory Visit (HOSPITAL_COMMUNITY)
Admission: EM | Admit: 2022-07-06 | Discharge: 2022-07-06 | Disposition: A | Discharge status: Home / Self Care | Payer: Medicaid Other | Attending: Family Medicine | Admitting: Family Medicine

## 2022-07-06 ENCOUNTER — Encounter (HOSPITAL_COMMUNITY): Payer: Self-pay

## 2022-07-06 ENCOUNTER — Ambulatory Visit (INDEPENDENT_AMBULATORY_CARE_PROVIDER_SITE_OTHER): Payer: Medicaid Other

## 2022-07-06 DIAGNOSIS — R109 Unspecified abdominal pain: Secondary | ICD-10-CM | POA: Diagnosis not present

## 2022-07-06 DIAGNOSIS — R1110 Vomiting, unspecified: Secondary | ICD-10-CM | POA: Diagnosis not present

## 2022-07-06 DIAGNOSIS — K5900 Constipation, unspecified: Secondary | ICD-10-CM

## 2022-07-06 DIAGNOSIS — R1084 Generalized abdominal pain: Secondary | ICD-10-CM

## 2022-07-06 LAB — POCT URINALYSIS DIPSTICK, ED / UC
Bilirubin Urine: NEGATIVE
Glucose, UA: NEGATIVE mg/dL
Ketones, ur: NEGATIVE mg/dL
Leukocytes,Ua: NEGATIVE
Nitrite: NEGATIVE
Protein, ur: NEGATIVE mg/dL
Specific Gravity, Urine: 1.025 (ref 1.005–1.030)
Urobilinogen, UA: 0.2 mg/dL (ref 0.0–1.0)
pH: 5.5 (ref 5.0–8.0)

## 2022-07-06 LAB — POC URINE PREG, ED: Preg Test, Ur: NEGATIVE

## 2022-07-06 MED ORDER — LACTULOSE 20 GM/30ML PO SOLN: 30.0000 mL | Freq: Two times a day (BID) | ORAL | 0 refills | Status: DC | PRN

## 2022-07-06 NOTE — ED Provider Notes (Signed)
[facility]   [id number] 07/06/22 Arrival Time: 0935  ASSESSMENT & PLAN:  1. Generalized abdominal pain   2. Constipation, unspecified constipation type    I have personally viewed the imaging studies ordered this visit. Abd 2 views: No acute changes; few air-fluid levels. Radiology read normal.  Prefers trial of: Meds ordered this encounter  Medications   Lactulose 20 GM/30ML SOLN    Sig: Take 30 mLs (20 g total) by mouth 2 (two) times daily as needed (for constipation).    Dispense:  450 mL    Refill:  0   You have been seen today for abdominal pain. Your evaluation was not suggestive of any emergent condition requiring medical intervention at this time. However, some abdominal problems make take more time to appear. Therefore, it is very important for you to pay attention to any new symptoms or worsening of your current condition.  Please return here or to the Emergency Department immediately should you begin to feel worse in any way or have any of the following symptoms: increasing or different abdominal pain, persistent vomiting, inability to drink fluids, fevers, or shaking chills.   Reviewed expectations re: course of current medical issues. Questions answered. Outlined signs and symptoms indicating need for more acute intervention. Patient verbalized understanding. After Visit Summary given.   SUBJECTIVE: History from: patient. Shelley Higgins is a 41 y.o. female who presents with complaint of mild to moderate non-radiating generalized abd discomfort; x 5 days; no BM in 5 days. Discomfort described as "feeling bloated" and dull; does note wake her at night. Reports normal flatus. Symptoms are stable since beginning. Fever: absent. Aggravating factors: have not been identified. Alleviating factors: GasX with mild help. Associated symptoms: reports emesis associated with "bad bloating" 1 d ago; mild sporadic nausea. She denies diarrhea and melena. Appetite: normal. PO  intake: decreased. Ambulatory without assistance. Urinary symptoms: none. Normally has BM daily. History of similar: no. No h/o abd surgery.  Patient's last menstrual period was 06/15/2022 (approximate).  Past Surgical History:  Procedure Laterality Date   INDUCED ABORTION     WISDOM TOOTH EXTRACTION       OBJECTIVE:  Vitals:   07/06/22 0957  BP: 110/83  Pulse: 85  Resp: 18  Temp: 98.9 F (37.2 C)  TempSrc: Oral  SpO2: 100%    General appearance: alert, oriented, no acute distress HEENT: [place]; AT; oropharynx moist Lungs: unlabored respirations Abdomen: soft; with mild distention; mild and generalized tenderness with palpation; normal bowel sounds; without masses or organomegaly; without guarding or rebound tenderness Back: without CVA tenderness to palpation; FROM at waist Extremities: without LE edema; symmetrical; without gross deformities Skin: warm and dry Neurologic: normal gait Psychological: alert and cooperative; normal mood and affect  Labs: Results for orders placed or performed during the hospital encounter of 07/06/22  POC Urinalysis dipstick  Result Value Ref Range   Glucose, UA NEGATIVE NEGATIVE mg/dL   Bilirubin Urine NEGATIVE NEGATIVE   Ketones, ur NEGATIVE NEGATIVE mg/dL   Specific Gravity, Urine 1.025 1.005 - 1.030   Hgb urine dipstick TRACE (A) NEGATIVE   pH 5.5 5.0 - 8.0   Protein, ur NEGATIVE NEGATIVE mg/dL   Urobilinogen, UA 0.2 0.0 - 1.0 mg/dL   Nitrite NEGATIVE NEGATIVE   Leukocytes,Ua NEGATIVE NEGATIVE  POC urine pregnancy  Result Value Ref Range   Preg Test, Ur NEGATIVE NEGATIVE   Labs Reviewed  POCT URINALYSIS DIPSTICK, ED / UC - Abnormal; Notable for the following components:  Result Value   Hgb urine dipstick TRACE (*)    All other components within normal limits  POC URINE PREG, ED    Imaging: DG Abd 2 Views  Result Date: 07/06/2022 CLINICAL DATA:  Abdominal pain and vomiting as well as back pain. Evaluate for small bowel  obstruction. No bowel movement 5 days. EXAM: ABDOMEN - 2 VIEW COMPARISON:  None Available. FINDINGS: Bowel gas pattern is nonobstructive. There is mild fecal retention throughout the colon. No evidence of dilated small bowel loops or free peritoneal air. Several pelvic phleboliths are present. Bony structures are normal. IMPRESSION: Nonobstructive bowel gas pattern with mild fecal retention throughout the colon. Electronically Signed   By: Marin Olp M.D.   On: 07/06/2022 10:57     No Known Allergies                                             Past Medical History:  Diagnosis Date   Anxiety    Phreesia 09/08/2020   Dermoid cyst    GERD (gastroesophageal reflux disease)    Phreesia 09/08/2020   Hyperlipidemia    Pneumonia    TB (tuberculosis)    Tuberculosis    Phreesia 09/08/2020    Social History   Socioeconomic History   Marital status: Single    Spouse name: Not on file   Number of children: Not on file   Years of education: Not on file   Highest education level: Not on file  Occupational History   Not on file  Tobacco Use   Smoking status: Former    Years: 20.00    Types: Cigarettes    Quit date: 06/04/2020    Years since quitting: 2.0   Smokeless tobacco: Never  Vaping Use   Vaping Use: Never used  Substance and Sexual Activity   Alcohol use: Yes    Alcohol/week: 6.0 standard drinks of alcohol    Types: 3 Glasses of wine, 3 Shots of liquor per week    Comment: socially   Drug use: No   Sexual activity: Yes  Other Topics Concern   Not on file  Social History Narrative   Not on file   Social Determinants of Health   Financial Resource Strain: Not on file  Food Insecurity: Not on file  Transportation Needs: Not on file  Physical Activity: Not on file  Stress: Not on file  Social Connections: Not on file  Intimate Partner Violence: Not on file    Family History  Problem Relation Age of Onset   Stroke Mother    Hypertension Mother    Diabetes Mother     Miscarriages / [place] Son      Vanessa Kick, MD 07/06/22 1136

## 2022-07-06 NOTE — Discharge Instructions (Addendum)

## 2022-07-06 NOTE — ED Triage Notes (Signed)
Patient c/o bloating, gas pain, and back pain. States she has not been able to have a BM for 5 days. States she has been gasx.

## 2022-09-23 ENCOUNTER — Emergency Department (HOSPITAL_COMMUNITY): Payer: Medicaid Other

## 2022-09-23 ENCOUNTER — Encounter (HOSPITAL_COMMUNITY): Payer: Self-pay | Admitting: *Deleted

## 2022-09-23 ENCOUNTER — Emergency Department (HOSPITAL_COMMUNITY)
Admission: EM | Admit: 2022-09-23 | Discharge: 2022-09-23 | Disposition: A | Discharge status: Home / Self Care | Payer: Medicaid Other | Attending: Emergency Medicine | Admitting: Emergency Medicine

## 2022-09-23 ENCOUNTER — Other Ambulatory Visit: Payer: Self-pay

## 2022-09-23 DIAGNOSIS — R079 Chest pain, unspecified: Secondary | ICD-10-CM | POA: Diagnosis present

## 2022-09-23 LAB — COMPREHENSIVE METABOLIC PANEL
ALT: 16 U/L (ref 0–44)
AST: 21 U/L (ref 15–41)
Albumin: 4.2 g/dL (ref 3.5–5.0)
Alkaline Phosphatase: 41 U/L (ref 38–126)
Anion gap: 7 (ref 5–15)
BUN: 10 mg/dL (ref 6–20)
CO2: 27 mmol/L (ref 22–32)
Calcium: 9.2 mg/dL (ref 8.9–10.3)
Chloride: 101 mmol/L (ref 98–111)
Creatinine, Ser: 1.07 mg/dL — ABNORMAL HIGH (ref 0.44–1.00)
GFR, Estimated: >60 mL/min (ref >60)
Glucose, Bld: 91 mg/dL (ref 70–99)
Potassium: 4.1 mmol/L (ref 3.5–5.1)
Sodium: 135 mmol/L (ref 135–145)
Total Bilirubin: 0.3 mg/dL (ref 0.3–1.2)
Total Protein: 7.6 g/dL (ref 6.5–8.1)

## 2022-09-23 LAB — CBC WITH DIFFERENTIAL/PLATELET
Abs Immature Granulocytes: 0.02 10*3/uL (ref 0.00–0.07)
Basophils Absolute: 0 10*3/uL (ref 0.0–0.1)
Basophils Relative: 1 %
Eosinophils Absolute: 0.1 10*3/uL (ref 0.0–0.5)
Eosinophils Relative: 1 %
HCT: 41.1 % (ref 36.0–46.0)
Hemoglobin: 13.9 g/dL (ref 12.0–15.0)
Immature Granulocytes: 0 %
Lymphocytes Relative: 27 %
Lymphs Abs: 1.8 10*3/uL (ref 0.7–4.0)
MCH: 29.4 pg (ref 26.0–34.0)
MCHC: 33.8 g/dL (ref 30.0–36.0)
MCV: 86.9 fL (ref 80.0–100.0)
Monocytes Absolute: 0.7 10*3/uL (ref 0.1–1.0)
Monocytes Relative: 10 %
Neutro Abs: 4 10*3/uL (ref 1.7–7.7)
Neutrophils Relative %: 61 %
Platelets: 281 10*3/uL (ref 150–400)
RBC: 4.73 MIL/uL (ref 3.87–5.11)
RDW: 12.1 % (ref 11.5–15.5)
WBC: 6.5 10*3/uL (ref 4.0–10.5)
nRBC: 0 % (ref 0.0–0.2)

## 2022-09-23 LAB — LIPASE, BLOOD: Lipase: 40 U/L (ref 11–51)

## 2022-09-23 LAB — TROPONIN I (HIGH SENSITIVITY): Troponin I (High Sensitivity): <2 ng/L (ref <18)

## 2022-09-23 LAB — I-STAT BETA HCG BLOOD, ED (MC, WL, AP ONLY): I-stat hCG, quantitative: <5 m[IU]/mL (ref <5)

## 2022-09-23 MED ORDER — FAMOTIDINE 20 MG PO TABS
40.0000 mg | ORAL_TABLET | Freq: Once | ORAL | Status: AC
  Administered 2022-09-23: 40 mg via ORAL
  Filled 2022-09-23: qty 2

## 2022-09-23 MED ORDER — ALUM & MAG HYDROXIDE-SIMETH 200-200-20 MG/5ML PO SUSP
30.0000 mL | Freq: Once | ORAL | Status: AC
  Administered 2022-09-23: 30 mL via ORAL
  Filled 2022-09-23: qty 30

## 2022-09-23 MED ORDER — FAMOTIDINE 40 MG PO TABS: 40.0000 mg | ORAL_TABLET | Freq: Two times a day (BID) | ORAL | 0 refills | Status: DC

## 2022-09-23 NOTE — ED Provider Triage Note (Signed)
Emergency Medicine Provider Triage Evaluation Note  Shelley Higgins , a 41 y.o. female  was evaluated in triage.  Pt complains of epigastric and mid sternal chest pain that radiated to left chest and back on Christmas night.  She states it was intense and she has not experienced pain like that before. It occurred again yesterday, it is intermittent.  She also reports some shortness of breath with it.  Denies fever, syncope, nausea, vomiting, diarrhea.   Review of Systems  Positive: As above Negative: As above  Physical Exam  BP 117/84   Pulse 78   Temp 98.5 F (36.9 C) (Oral)   Resp 16   LMP 09/02/2022 (Exact Date)   SpO2 100%  Gen:   Awake, no distress   Resp:  Normal effort  MSK:   Moves extremities without difficulty  Other:    Medical Decision Making  Medically screening exam initiated at 3:18 PM.  Appropriate orders placed.  Shelley Higgins was informed that the remainder of the evaluation will be completed by another provider, this initial triage assessment does not replace that evaluation, and the importance of remaining in the ED until their evaluation is complete.     Theressa Stamps R, [place] 09/23/22 [phone]

## 2022-09-23 NOTE — ED Notes (Signed)
All discharge instructions including follow up care and prescriptions reviewed with patient and patient verbalized understanding of same. Patient stable and ambulatory at time of discharge.  

## 2022-09-23 NOTE — ED Triage Notes (Signed)
Pt states she is supposed to be taking meds for high cholesterol and has not been doing for 6 months.  Pt states started having mid sternal chest pain that radiated to left chest on Christmas night, pt said it was so intense and but went to sleep. Then yesterday it happened again and then continued last night. Pt states there was radiation to left shoulder blade area. Pt states pain continues today and has SOB.

## 2022-09-23 NOTE — Discharge Instructions (Signed)
1.  Take famotidine twice daily for the next 1 to 2 weeks.  Follow the diet recommended for gastroesophageal reflux. 2.  Take extra strength Tylenol every 6 hours for suspected chest wall pain.  May also apply a Salonpas or warm moist heat. 3.  Return to the emergency department immediately if you feel short of breath, lightheaded, the quality of the pain is changing or other concerning symptoms.

## 2022-09-23 NOTE — ED Provider Notes (Signed)
[facility] EMERGENCY DEPARTMENT Provider Note   CSN: [id number] Arrival date & time: 09/23/22  1145     History  Chief Complaint  Patient presents with   Chest Pain    Shelley Higgins is a 41 y.o. female.  HPI Patient reports again severe pain in her left chest.  This occurred 2 days prior to arrival.  She reports it was radiating into her back and quite intense.  She had not had similar pain previously.  She was able to go to sleep at night.  However it recurred several more times the next day.  She feels slightly short of breath with that.  She has not recently had any fever or productive cough.  No pain or swelling in the legs.    Home Medications Prior to Admission medications   Medication Sig Start Date End Date Taking? Authorizing Provider  famotidine (PEPCID) 40 MG tablet Take 1 tablet (40 mg total) by mouth 2 (two) times daily. 09/23/22  Yes Charlesetta Shanks, MD  Lactulose 20 GM/30ML SOLN Take 30 mLs (20 g total) by mouth 2 (two) times daily as needed (for constipation). 07/06/22   Vanessa Kick, MD      Allergies    Patient has no known allergies.    Review of Systems   Review of Systems  Physical Exam Updated Vital Signs BP 116/72   Pulse 80   Temp 98.2 F (36.8 C) (Oral)   Resp 16   LMP 09/02/2022 (Exact Date)   SpO2 100%  Physical Exam Constitutional:      Comments: Alert nontoxic clinically well in appearance.  HENT:     Mouth/Throat:     Pharynx: Oropharynx is clear.  Eyes:     Extraocular Movements: Extraocular movements intact.  Cardiovascular:     Rate and Rhythm: Normal rate and regular rhythm.  Pulmonary:     Effort: Pulmonary effort is normal.     Breath sounds: Normal breath sounds.  Abdominal:     General: There is no distension.     Palpations: Abdomen is soft.     Tenderness: There is no abdominal tenderness. There is no guarding.  Musculoskeletal:        General: No tenderness. Normal range of motion.     Right  lower leg: No edema.     Left lower leg: No edema.  Skin:    General: Skin is warm and dry.  Neurological:     General: No focal deficit present.     Mental Status: She is oriented to person, place, and time.     Coordination: Coordination normal.  Psychiatric:        Mood and Affect: Mood normal.     ED Results / Procedures / Treatments   Labs (all labs ordered are listed, but only abnormal results are displayed) Labs Reviewed  COMPREHENSIVE METABOLIC PANEL - Abnormal; Notable for the following components:      Result Value   Creatinine, Ser 1.07 (*)    All other components within normal limits  CBC WITH DIFFERENTIAL/PLATELET  LIPASE, BLOOD  I-STAT BETA HCG BLOOD, ED ([facility], [facility], [facility] ONLY)  TROPONIN I (HIGH SENSITIVITY)    EKG EKG Interpretation  Date/Time:  Wednesday September 23 2022 13:53:06 EST Ventricular Rate:  78 PR Interval:  148 QRS Duration: 70 QT Interval:  348 QTC Calculation: 396 R Axis:   84 Text Interpretation: Normal sinus rhythm with sinus arrhythmia Normal ECG When compared with ECG of 27-Mar-2021 09:25, PREVIOUS  ECG IS PRESENT normal, no change from previous Confirmed by Charlesetta Shanks [phone]) on 09/23/2022 6:03:27 PM  Radiology No results found.  Procedures Procedures    Medications Ordered in ED Medications  alum & mag hydroxide-simeth (MAALOX/MYLANTA) 200-200-20 MG/5ML suspension 30 mL (30 mLs Oral Given 09/23/22 1917)  famotidine (PEPCID) tablet 40 mg (40 mg Oral Given 09/23/22 1917)    ED Course/ Medical Decision Making/ A&P                           Medical Decision Making Amount and/or Complexity of Data Reviewed Radiology: ordered.  Risk OTC drugs. Prescription drug management.   This with episodic chest pain over the past 2 days.  It has been waxing and waning in nature.  Differential diagnosis includes ACS\pneumonia\pneumothorax\GERD or esophageal spasm\musculoskeletal chest pain.  Will proceed with diagnostic evaluation.   Patient's vital signs are normal.  CBC normal with normal differential.  No leukocytosis.  Basic chemistry panel including LFTs normal.  No evidence of elevated LFTs or lipase to suggest biliary colic or pancreatitis.  Troponin normal.  2 view chest x-ray reviewed by radiology normal.  At this time, patient is low risk factors for ACS.  She has prior smoking history but is quit.  Does not have history of hypertension.  EKG normal and troponin normal.  At this point with 2 days of episodic ongoing pain do not suspect NSTEMI.  Low probability for PE.  Vital signs are normal without tachycardia, hypoxia or typical symptoms.  Patient does have history of GERD.  She has not been taking any reflux medications recently.  Will trial GI cocktail and Pepcid.  At discharge patient is well in appearance with normal vital signs.  She is stable for discharge and ongoing outpatient evaluation with PCP.  We discussed resuming Pepcid twice daily and dietary measures for GERD.  Careful return precautions for any ACS symptoms reviewed.        Final Clinical Impression(s) / ED Diagnoses Final diagnoses:  Chest pain, unspecified type    Rx / DC Orders ED Discharge Orders          Ordered    famotidine (PEPCID) 40 MG tablet  2 times daily        09/23/22 2009              Charlesetta Shanks, MD 09/29/22 1641

## 2023-04-13 IMAGING — DX DG HIP (WITH OR WITHOUT PELVIS) 2-3V*R*
3 series · 3 of 3 positions shown · non-contrast
Comparison: None.

CLINICAL DATA: Right hip pain.

EXAM:
DG HIP (WITH OR WITHOUT PELVIS) 2-3V RIGHT

[pelvis ap]
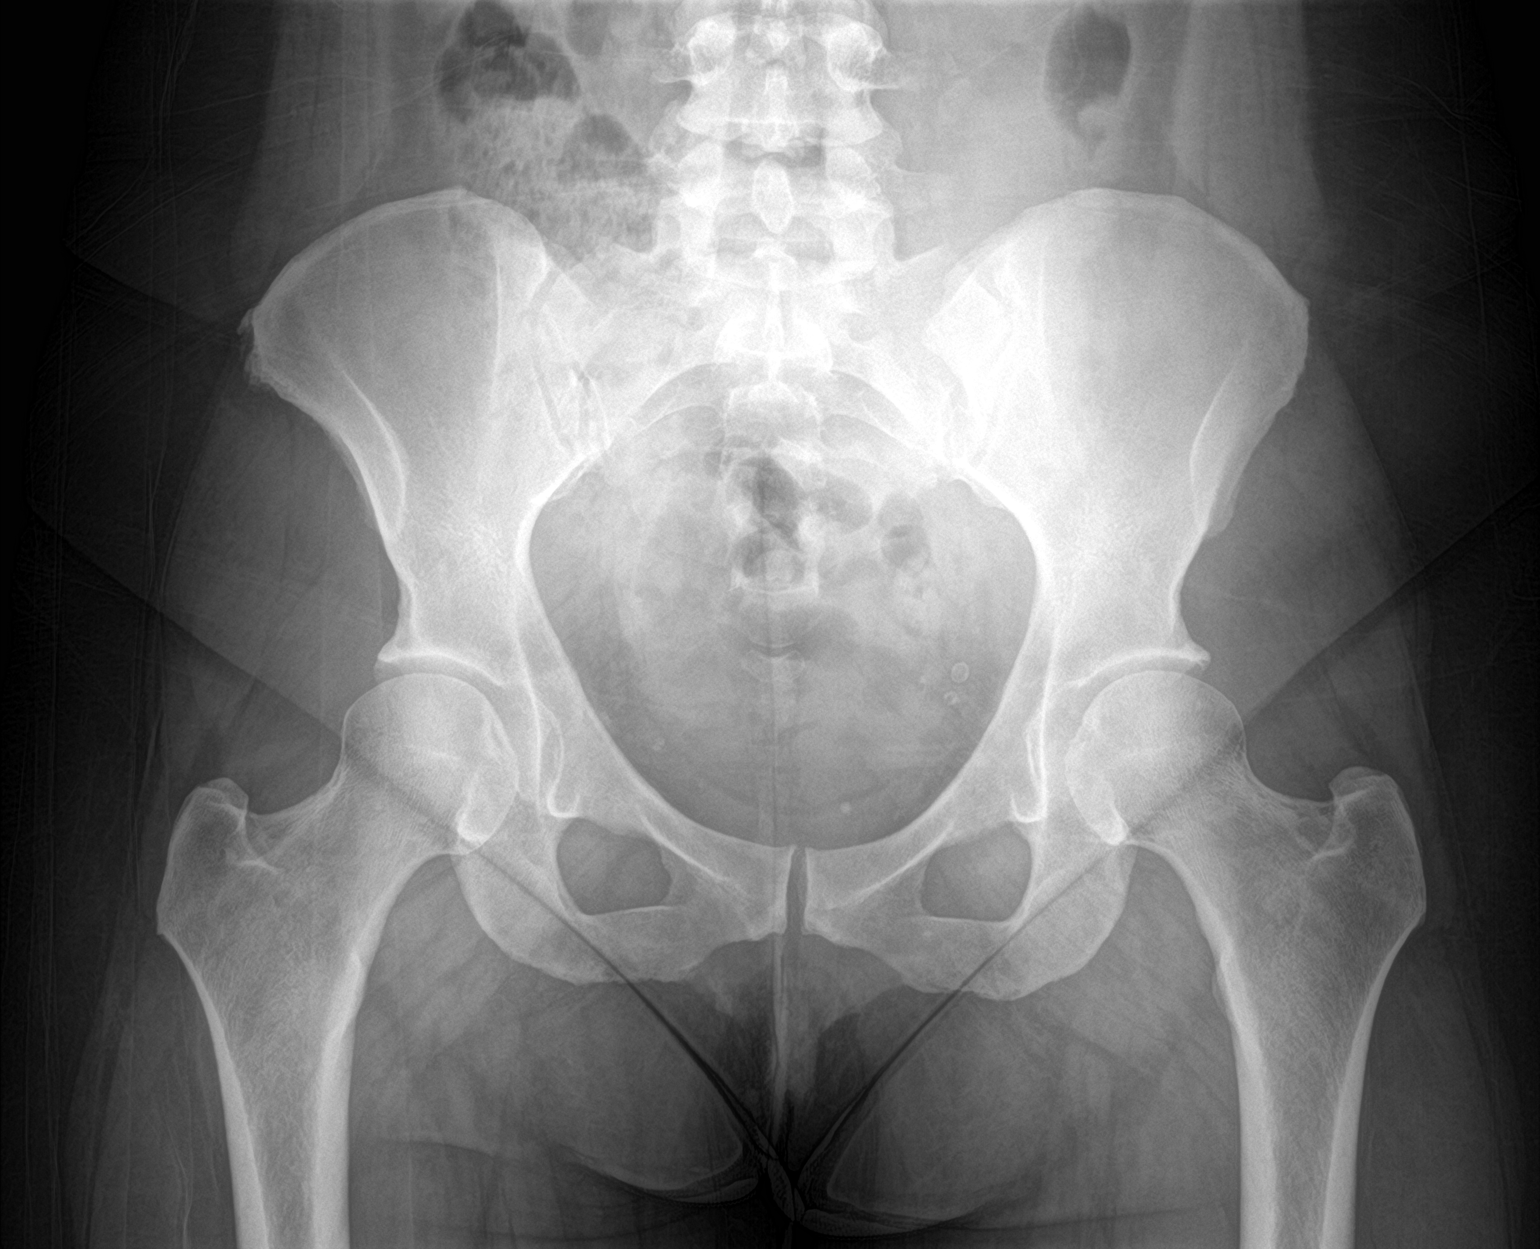

[hip ap]
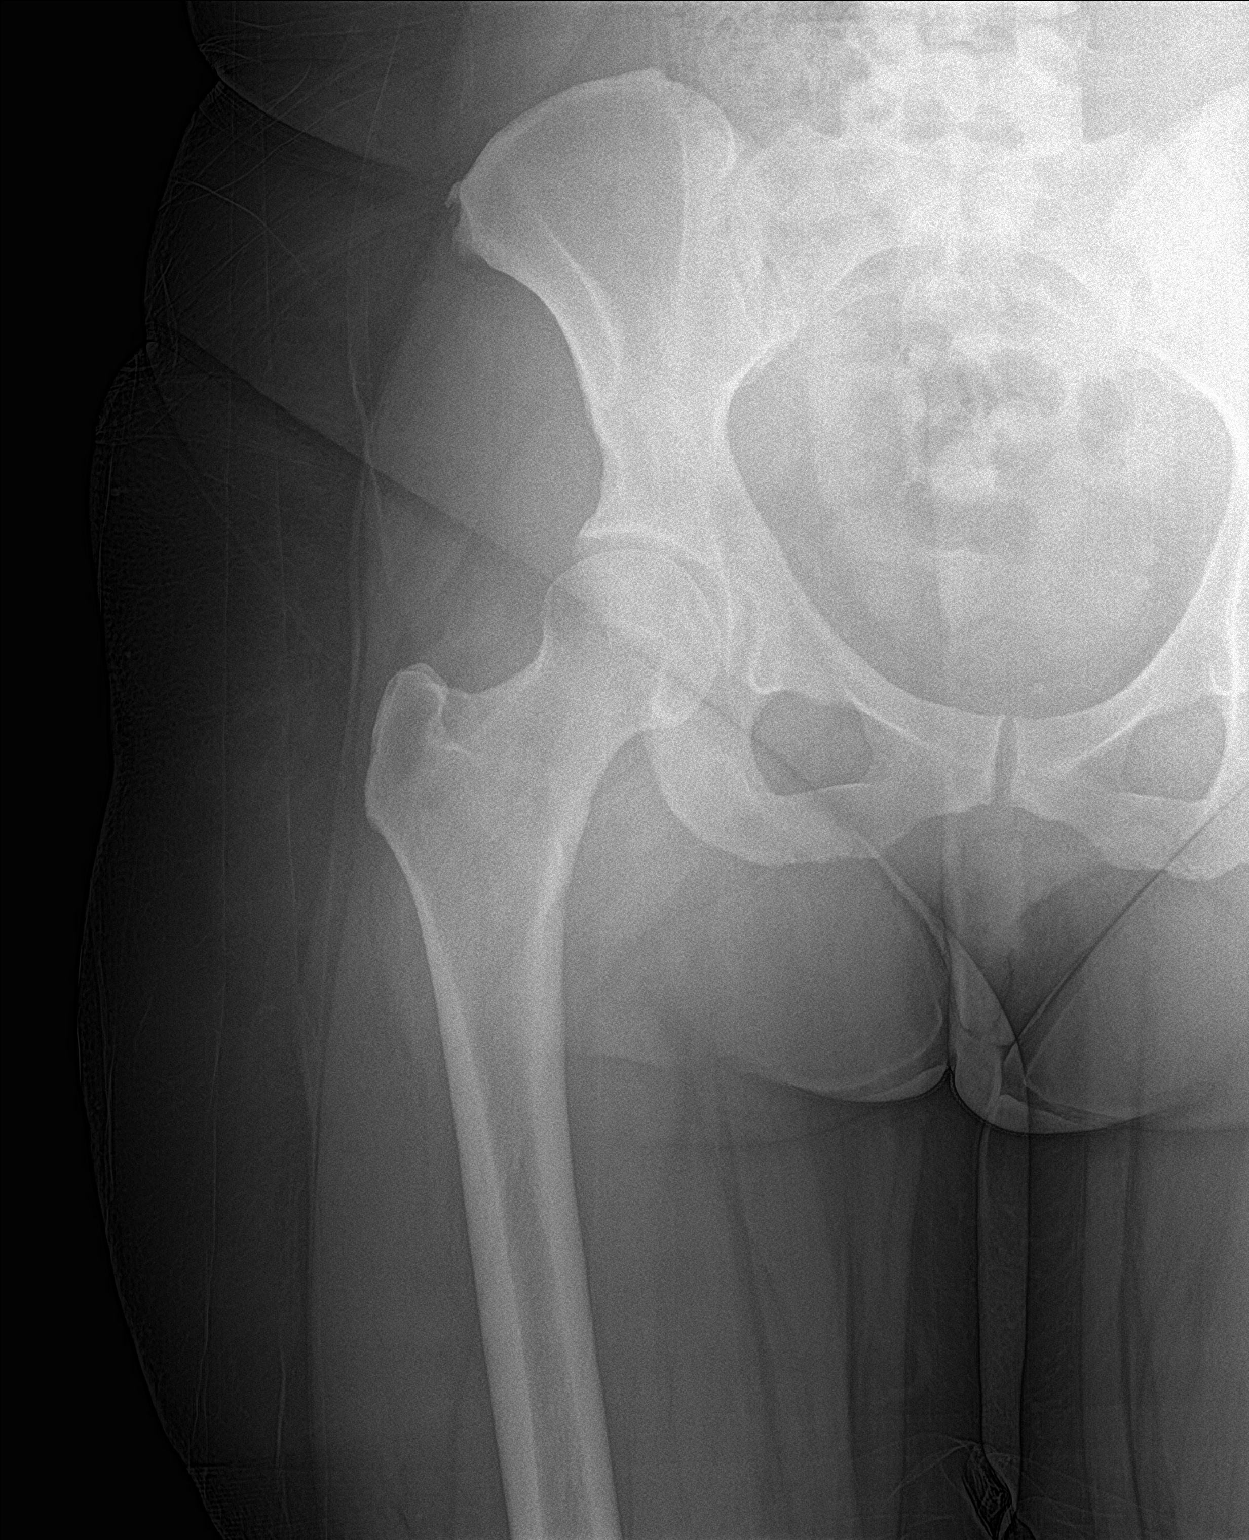

[hip lat]
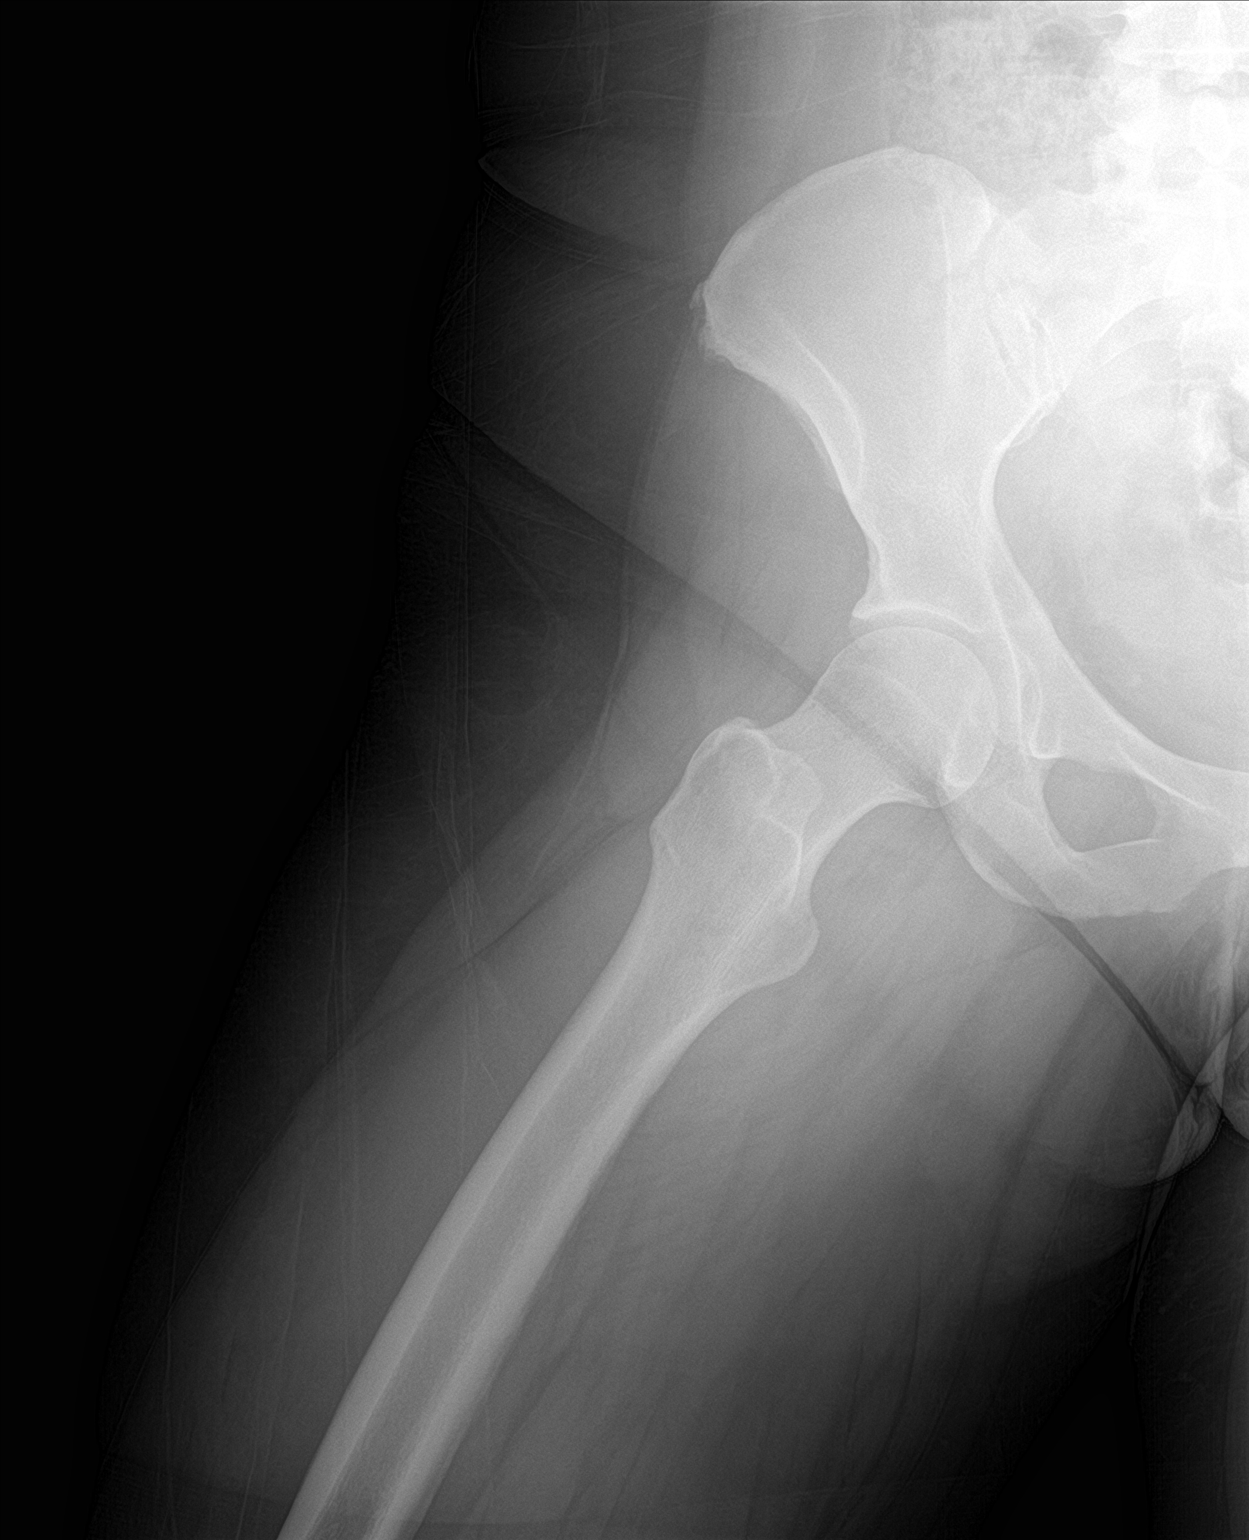

[3 of 3 positions shown; findings below may reference images not displayed]

FINDINGS: There is no evidence of hip fracture or dislocation. There is no
evidence of arthropathy or other focal bone abnormality.
IMPRESSION: Negative.

## 2023-07-05 IMAGING — CR DG CHEST 2V
2 series · 2 of 2 positions shown · non-contrast
Comparison: Chest radiograph dated 05/30/2016.

CLINICAL DATA: Positive PPD test.

EXAM:
CHEST - 2 VIEW

[w chest pa]
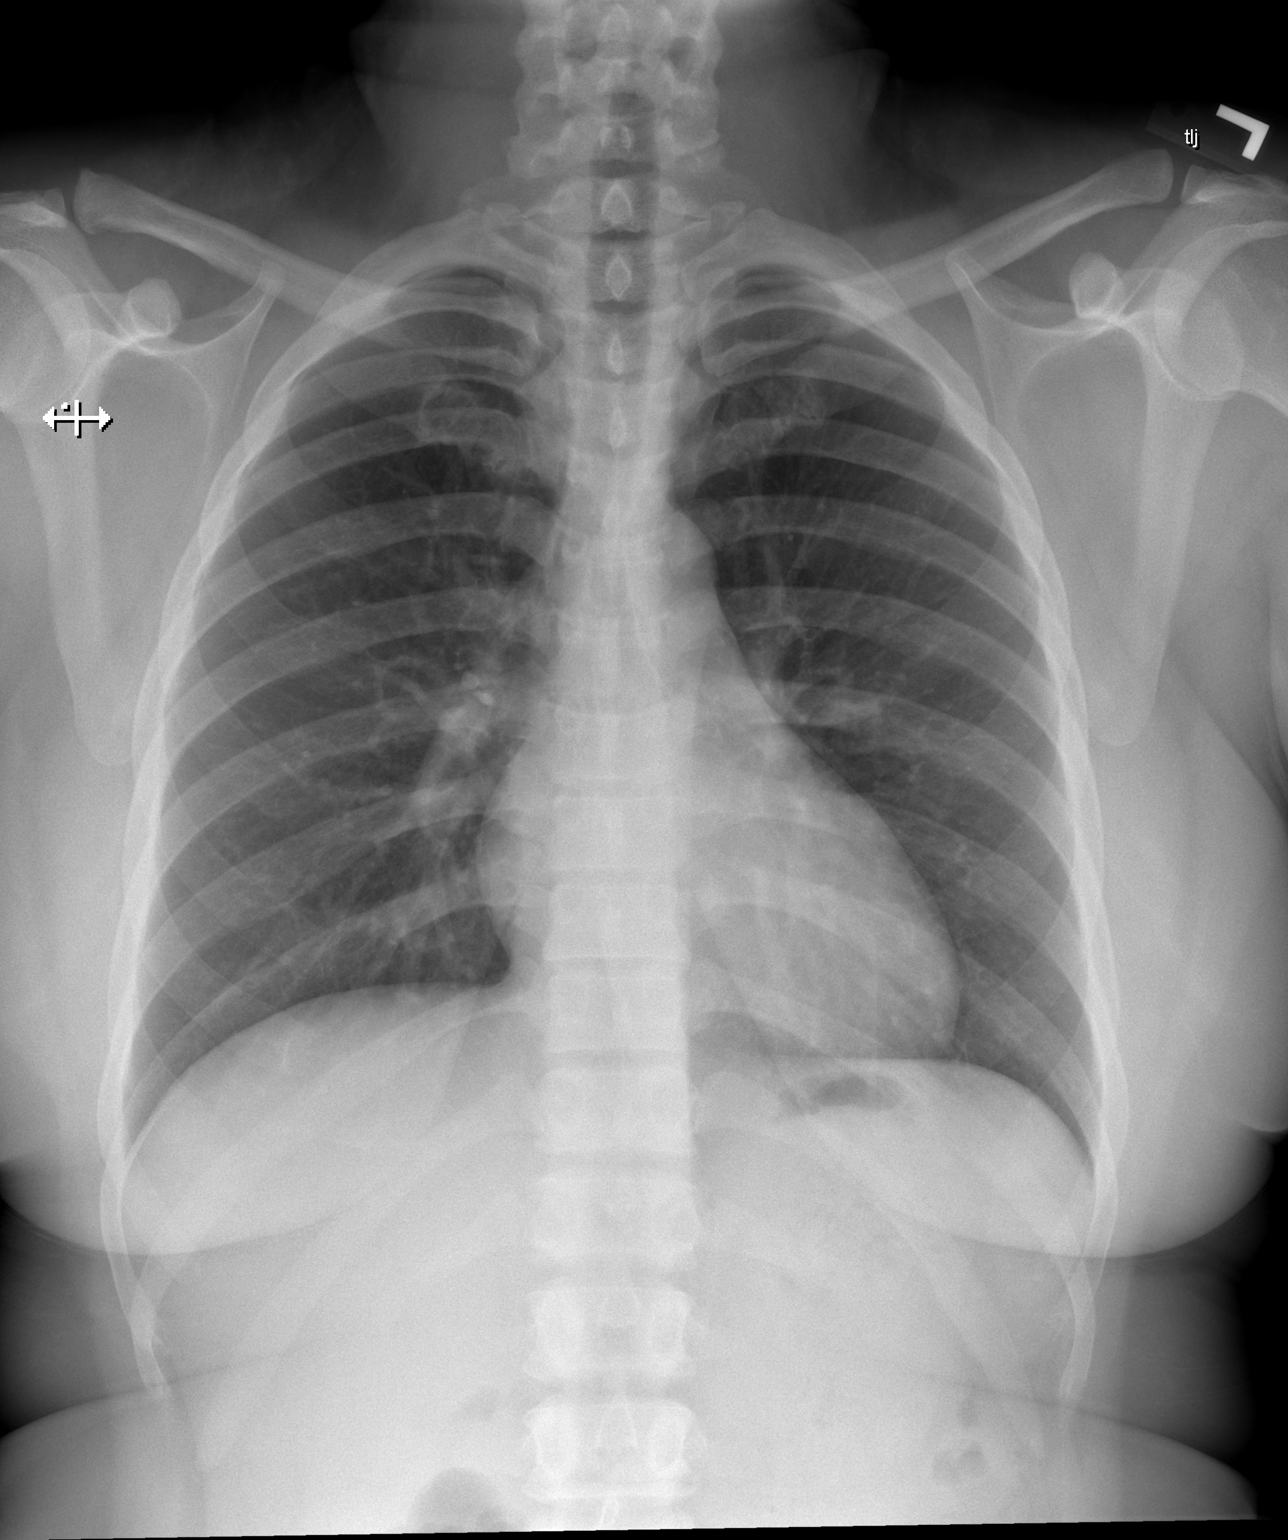

[w chest lat]
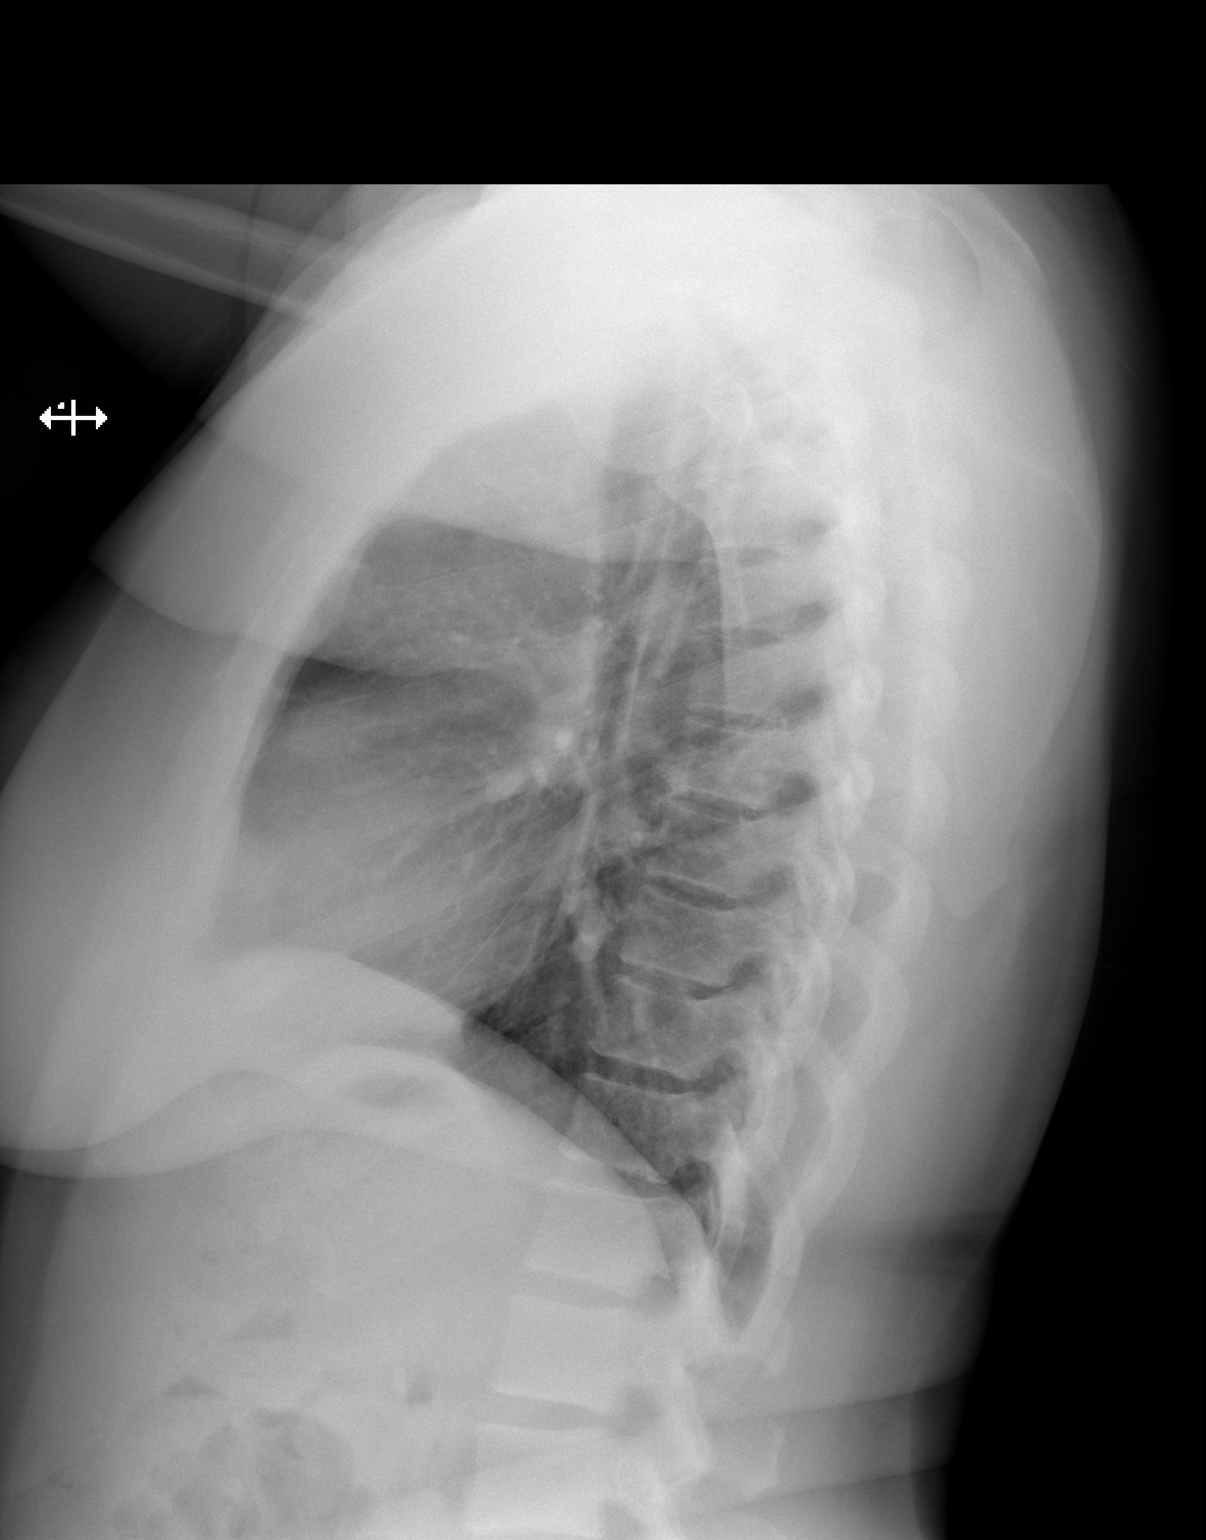

[2 of 2 positions shown; findings below may reference images not displayed]

FINDINGS: The heart size and mediastinal contours are within normal limits.
Both lungs are clear. The visualized skeletal structures are
unremarkable.
IMPRESSION: No active cardiopulmonary disease.

## 2023-07-13 ENCOUNTER — Ambulatory Visit (INDEPENDENT_AMBULATORY_CARE_PROVIDER_SITE_OTHER): Payer: Medicaid Other | Admitting: Family Medicine

## 2023-07-13 ENCOUNTER — Encounter (HOSPITAL_BASED_OUTPATIENT_CLINIC_OR_DEPARTMENT_OTHER): Payer: Self-pay | Admitting: Family Medicine

## 2023-07-13 VITALS — BP 115/70 | HR 74 | Ht 65.0 in | Wt 198.8 lb

## 2023-07-13 DIAGNOSIS — R1013 Epigastric pain: Secondary | ICD-10-CM | POA: Diagnosis not present

## 2023-07-13 DIAGNOSIS — Z0000 Encounter for general adult medical examination without abnormal findings: Secondary | ICD-10-CM

## 2023-07-13 DIAGNOSIS — F419 Anxiety disorder, unspecified: Secondary | ICD-10-CM

## 2023-07-13 DIAGNOSIS — K219 Gastro-esophageal reflux disease without esophagitis: Secondary | ICD-10-CM

## 2023-07-13 DIAGNOSIS — R350 Frequency of micturition: Secondary | ICD-10-CM | POA: Diagnosis not present

## 2023-07-13 MED ORDER — PANTOPRAZOLE SODIUM 40 MG PO TBEC: 40.0000 mg | DELAYED_RELEASE_TABLET | Freq: Every day | ORAL | 0 refills | Status: DC

## 2023-07-13 MED ORDER — HYDROXYZINE PAMOATE 25 MG PO CAPS
25.0000 mg | ORAL_CAPSULE | Freq: Three times a day (TID) | ORAL | 2 refills | Status: DC | PRN
Start: 2023-07-13 — End: 2023-07-28

## 2023-07-13 NOTE — Patient Instructions (Signed)

## 2023-07-13 NOTE — Progress Notes (Unsigned)
Established Patient Office Visit  Subjective   Patient ID: Shelley Higgins, female    DOB: 03-10-1981  Age: 42 y.o. MRN: [id number]  Chief Complaint  Patient presents with   Gastroesophageal Reflux   Bloated    Feels like stools are not regular, feels stool is more thinner than usual   Shelley Higgins is a 42 year old female patient who presents today for abdominal pain.   Tums or famotidine kinda helps with burning sensation  Eat one thing and her stomach gets bloated very quickly- does not notice it correlates to her diet  Reports issues with abd bloating and increase in flatulence  Stool is very skinny. Denies pale stool or abnormal color.  Struggle, feel constipated, denies ribbon shape. Mid upper abd pain- burning sensation  She reports she is drinking about 4x per week.  Reports urinary frequently- she is concerned about having diabetes. She thought she was urinating more often since she was staying more hydrated.   Anxiety- feels like she just "needs to calm down" She worries frequently  Reports her drinking helps with her increase in worrying but she knows this is a short term solution.   ABD PAIN:  Symptoms:  Fever/chills: no Nausea/vomiting: no Rash: no  Weight loss: no Decreased appetite: no Melena/BRBPR: no  Blood in stool: no Heartburn: yes Hematemesis: no  Diarrhea: no Constipation: yes Dysuria/urinary frequency or urgency: yes Hematuria: no History of sexually transmitted disease: no Recurrent NSAID use:  limits amount she is taking EtOH use: yes  Recent NSAID use: no  Vaginal bleeding: no         07/13/2023   10:29 AM 03/20/2021    2:41 PM  GAD 7 : Generalized Anxiety Score  Nervous, Anxious, on Edge 3 1  Control/stop worrying 2 1  Worry too much - different things 2 1  Trouble relaxing 2 1  Restless 1 1  Easily annoyed or irritable 1 2  Afraid - awful might happen 2 2  Total GAD 7 Score 13 9  Anxiety Difficulty Somewhat difficult  Somewhat difficult      07/13/2023   10:29 AM 03/20/2021    2:40 PM 12/02/2020   10:37 AM  PHQ9 SCORE ONLY  PHQ-9 Total Score 6 3 0     Review of Systems  Constitutional:  Negative for chills, fever and malaise/fatigue.  Eyes:  Negative for blurred vision and double vision.  Respiratory:  Negative for cough and shortness of breath.   Cardiovascular:  Negative for chest pain, palpitations and leg swelling.  Gastrointestinal:  Positive for heartburn. Negative for abdominal pain, blood in stool, constipation, diarrhea, melena, nausea and vomiting.  Genitourinary:  Positive for frequency.  Musculoskeletal:  Negative for myalgias.  Neurological:  Negative for dizziness, weakness and headaches.  Psychiatric/Behavioral:  Negative for depression and suicidal ideas. The patient is not nervous/anxious and does not have insomnia.       Objective:     BP 115/70   Pulse 74   Ht 5\' 5"  (1.651 m)   Wt 198 lb 12.8 oz (90.2 kg)   SpO2 100%   BMI 33.08 kg/m  BP Readings from Last 3 Encounters:  07/13/23 115/70  09/23/22 116/72  07/06/22 110/83     Physical Exam Constitutional:      Appearance: Normal appearance.  Cardiovascular:     Rate and Rhythm: Normal rate and regular rhythm.     Pulses: Normal pulses.     Heart sounds: Normal heart sounds.  Pulmonary:  Effort: Pulmonary effort is normal.     Breath sounds: Normal breath sounds.  Abdominal:     General: Bowel sounds are normal.     Palpations: Abdomen is soft.     Tenderness: There is abdominal tenderness in the epigastric area.  Neurological:     Mental Status: She is alert.  Psychiatric:        Mood and Affect: Mood normal.        Behavior: Behavior normal.       Assessment & Plan:   Epigastric abdominal pain Assessment & Plan: Patient is a pleasant 42 year old female patient who presents today for concerns of epigastric pain, abdominal bloating, and abnormal stool shape that have been going on for a while. She  describes this burning sensation/pain as a dull and becomes more noticeable after eating a meal. Denies N/V, diarrhea, fever/chills, change in appetite, changes in bowel habits, dysuria, hematuria, syncopal episodes, unexplained weight loss, and recent antibiotic use. Patient is well-appearing and in no acute distress. Physical exam reveals tenderness present in the epigastric region of her abdomen. No rebound tenderness or guarding present.  Initial testing will include assessment of liver function, white blood cell count, and H. pylori test. Will trial pantoprazole for acid reflux symptoms. Will follow-up with epigastric pain after trying PPI for 4-8 weeks.   Orders: -     Pantoprazole Sodium; Take 1 tablet (40 mg total) by mouth daily.  Dispense: 90 tablet; Refill: 0 -     CBC with Differential/Platelet -     H. pylori breath test  Urinary frequency Assessment & Plan: Patient reports urinary frequency recently. Denies polydipsia and polyphagia. Review of hemoglobin A1c, patient has a history of pre-diabetes. Will check hemoglobin A1c today.   Orders: -     Hemoglobin A1c  Anxiety Assessment & Plan: Patient reports an increase in anxiety over the past few months. GAD7 completed with score of 13. She report that she notices using alcohol to calm her nerves. She reports drinking at least 4x per week. She reports she needs to "quit drinking so much" and is to the point now where she is concerned about her habits. Discussed pharmacotherapy and counseling services regarding management of anxiety. She would like to try an as needed medication. Will trial hydroxyzine 25mg  PRN for acute anxiety.   Orders: -     Comprehensive metabolic panel -     hydrOXYzine Pamoate; Take 1 capsule (25 mg total) by mouth every 8 (eight) hours as needed.  Dispense: 30 capsule; Refill: 2  Wellness examination Assessment & Plan: Plan to complete CPE in near future. Will obtain routine labs today.   Orders: -      Lipid panel -     TSH Rfx on Abnormal to Free T4    Return in about 6 weeks (around 08/24/2023) for Physical & mood f/u .    Alyson Reedy, FNP

## 2023-07-14 DIAGNOSIS — F419 Anxiety disorder, unspecified: Secondary | ICD-10-CM | POA: Insufficient documentation

## 2023-07-14 DIAGNOSIS — R350 Frequency of micturition: Secondary | ICD-10-CM | POA: Insufficient documentation

## 2023-07-14 DIAGNOSIS — Z0000 Encounter for general adult medical examination without abnormal findings: Secondary | ICD-10-CM | POA: Insufficient documentation

## 2023-07-14 DIAGNOSIS — R1013 Epigastric pain: Secondary | ICD-10-CM | POA: Insufficient documentation

## 2023-07-14 LAB — COMPREHENSIVE METABOLIC PANEL
ALT: 16 [IU]/L (ref 0–32)
AST: 19 [IU]/L (ref 0–40)
Albumin: 4.5 g/dL (ref 3.9–4.9)
Alkaline Phosphatase: 50 [IU]/L (ref 44–121)
BUN/Creatinine Ratio: 10 (ref 9–23)
BUN: 9 mg/dL (ref 6–24)
Bilirubin Total: 0.4 mg/dL (ref 0.0–1.2)
CO2: 25 mmol/L (ref 20–29)
Calcium: 9.4 mg/dL (ref 8.7–10.2)
Chloride: 95 mmol/L — ABNORMAL LOW (ref 96–106)
Creatinine, Ser: 0.87 mg/dL (ref 0.57–1.00)
Globulin, Total: 2.6 g/dL (ref 1.5–4.5)
Glucose: 94 mg/dL (ref 70–99)
Potassium: 4.6 mmol/L (ref 3.5–5.2)
Sodium: 143 mmol/L (ref 134–144)
Total Protein: 7.1 g/dL (ref 6.0–8.5)
eGFR: 86 mL/min/{1.73_m2} (ref >59)

## 2023-07-14 LAB — LIPID PANEL
Chol/HDL Ratio: 3.5 {ratio} (ref 0.0–4.4)
Cholesterol, Total: 230 mg/dL — ABNORMAL HIGH (ref 100–199)
HDL: 66 mg/dL (ref >39)
LDL Chol Calc (NIH): 146 mg/dL — ABNORMAL HIGH (ref 0–99)
Triglycerides: 101 mg/dL (ref 0–149)
VLDL Cholesterol Cal: 18 mg/dL (ref 5–40)

## 2023-07-14 LAB — CBC WITH DIFFERENTIAL/PLATELET
Basophils Absolute: 0 10*3/uL (ref 0.0–0.2)
Basos: 1 %
EOS (ABSOLUTE): 0 10*3/uL (ref 0.0–0.4)
Eos: 1 %
Hematocrit: 41.1 % (ref 34.0–46.6)
Hemoglobin: 13.2 g/dL (ref 11.1–15.9)
Immature Grans (Abs): 0 10*3/uL (ref 0.0–0.1)
Immature Granulocytes: 0 %
Lymphocytes Absolute: 1.6 10*3/uL (ref 0.7–3.1)
Lymphs: 27 %
MCH: 29.1 pg (ref 26.6–33.0)
MCHC: 32.1 g/dL (ref 31.5–35.7)
MCV: 91 fL (ref 79–97)
Monocytes Absolute: 0.6 10*3/uL (ref 0.1–0.9)
Monocytes: 10 %
Neutrophils Absolute: 3.7 10*3/uL (ref 1.4–7.0)
Neutrophils: 61 %
Platelets: 271 10*3/uL (ref 150–450)
RBC: 4.54 x10E6/uL (ref 3.77–5.28)
RDW: 13.1 % (ref 11.7–15.4)
WBC: 5.9 10*3/uL (ref 3.4–10.8)

## 2023-07-14 LAB — HEMOGLOBIN A1C
Est. average glucose Bld gHb Est-mCnc: 120 mg/dL
Hgb A1c MFr Bld: 5.8 % — ABNORMAL HIGH (ref 4.8–5.6)

## 2023-07-14 LAB — TSH RFX ON ABNORMAL TO FREE T4: TSH: 1.18 u[IU]/mL (ref 0.450–4.500)

## 2023-07-14 LAB — H. PYLORI BREATH TEST: H pylori Breath Test: NEGATIVE

## 2023-07-14 NOTE — Assessment & Plan Note (Signed)
Plan to complete CPE in near future. Will obtain routine labs today.

## 2023-07-14 NOTE — Assessment & Plan Note (Signed)
Patient reports an increase in anxiety over the past few months. GAD7 completed with score of 13. She report that she notices using alcohol to calm her nerves. She reports drinking at least 4x per week. She reports she needs to "quit drinking so much" and is to the point now where she is concerned about her habits. Discussed pharmacotherapy and counseling services regarding management of anxiety. She would like to try an as needed medication. Will trial hydroxyzine 25mg  PRN for acute anxiety.

## 2023-07-14 NOTE — Assessment & Plan Note (Addendum)
Patient is a pleasant 42 year old female patient who presents today for concerns of epigastric pain, abdominal bloating, and abnormal stool shape that have been going on for a while. She describes this burning sensation/pain as a dull and becomes more noticeable after eating a meal. Denies N/V, diarrhea, fever/chills, change in appetite, changes in bowel habits, dysuria, hematuria, syncopal episodes, unexplained weight loss, and recent antibiotic use. Patient is well-appearing and in no acute distress. Physical exam reveals tenderness present in the epigastric region of her abdomen. No rebound tenderness or guarding present.  Initial testing will include assessment of liver function, white blood cell count, and H. pylori test. Will trial pantoprazole for acid reflux symptoms. Will follow-up with epigastric pain after trying PPI for 4-8 weeks.

## 2023-07-14 NOTE — Assessment & Plan Note (Addendum)
Patient reports urinary frequency recently. Denies polydipsia and polyphagia. Review of hemoglobin A1c, patient has a history of pre-diabetes. Will check hemoglobin A1c today.

## 2023-07-16 ENCOUNTER — Telehealth (HOSPITAL_BASED_OUTPATIENT_CLINIC_OR_DEPARTMENT_OTHER): Payer: Self-pay | Admitting: *Deleted

## 2023-07-16 ENCOUNTER — Encounter (HOSPITAL_BASED_OUTPATIENT_CLINIC_OR_DEPARTMENT_OTHER): Payer: Self-pay | Admitting: *Deleted

## 2023-07-16 NOTE — Telephone Encounter (Signed)
LVM to see if patient would like to receive the flu vaccine this season

## 2023-07-28 ENCOUNTER — Ambulatory Visit (HOSPITAL_COMMUNITY)
Admission: EM | Admit: 2023-07-28 | Discharge: 2023-07-28 | Disposition: A | Discharge status: Home / Self Care | Payer: Medicaid Other | Attending: Emergency Medicine | Admitting: Emergency Medicine

## 2023-07-28 ENCOUNTER — Encounter (HOSPITAL_COMMUNITY): Payer: Self-pay

## 2023-07-28 DIAGNOSIS — K047 Periapical abscess without sinus: Secondary | ICD-10-CM | POA: Diagnosis not present

## 2023-07-28 DIAGNOSIS — K029 Dental caries, unspecified: Secondary | ICD-10-CM | POA: Diagnosis not present

## 2023-07-28 MED ORDER — AMOXICILLIN-POT CLAVULANATE 875-125 MG PO TABS: 1.0000 | ORAL_TABLET | Freq: Two times a day (BID) | ORAL | 0 refills | Status: DC

## 2023-07-28 NOTE — ED Provider Notes (Signed)
[facility]    CSN: [id number] Arrival date & time: 07/28/23  0930      History   Chief Complaint Chief Complaint  Patient presents with   Dental Injury    HPI Shelley Higgins is a 42 y.o. female.   Patient presents to clinic complaining of left upper dental pain and swelling.  Pain and swelling started 2 days ago when she broke one of her left upper molars.  She has been using ice, and alternating between Tylenol and ibuprofen for pain and swelling.  She does not currently have a dentist.  Denies any fevers.  Able to eat and drink.  The history is provided by the patient and medical records.  Dental Injury    Past Medical History:  Diagnosis Date   Anxiety    [employer]rees[employer] 09/08/2020   Dermoid cyst    GERD (gastroesophageal reflux disease)    [employer] 09/08/2020   Hyperlipidemia    Pneumonia    TB (tuberculosis)    Tuberculosis    [employer]ia 09/08/2020    Patient Active Problem List   Diagnosis Date Noted   Epigastric abdominal pain 07/14/2023   Urinary frequency 07/14/2023   Wellness examination 07/14/2023   Anxiety 07/14/2023   Vitamin D deficiency 09/10/2020   Hyperlipidemia 09/10/2020   GERD (gastroesophageal reflux disease) 09/09/2020   Ovarian cyst 08/13/2014    Past Surgical History:  Procedure Laterality Date   INDUCED ABORTION     WISDOM TOOTH EXTRACTION      OB History     Gravida  3   Para  2   Term  2   Preterm      AB  1   Living  2      SAB      IAB  1   Ectopic      Multiple      Live Births               Home Medications    Prior to Admission medications   Medication Sig Start Date End Date Taking? Authorizing Provider  amoxicillin-clavulanate (AUGMENTIN) 875-125 MG tablet Take 1 tablet by mouth every 12 (twelve) hours. 07/28/23  Yes Rinaldo Ratel, [place], FNP  pantoprazole (PROTONIX) 40 MG tablet Take 1 tablet (40 mg total) by mouth daily. 07/13/23   Alyson Reedy, FNP    Family History Family  History  Problem Relation Age of Onset   Stroke Mother    Hypertension Mother    Diabetes Mother    Miscarriages / [place] Son     Social History Social History   Tobacco Use   Smoking status: Former    Current packs/day: 0.00    Types: Cigarettes    Start date: 06/04/2000    Quit date: 06/04/2020    Years since quitting: 3.1   Smokeless tobacco: Never  Vaping Use   Vaping status: Never Used  Substance Use Topics   Alcohol use: Yes    Alcohol/week: 6.0 standard drinks of alcohol    Types: 3 Glasses of wine, 3 Shots of liquor per week    Comment: socially   Drug use: No     Allergies   Patient has no known allergies.   Review of Systems Review of Systems  Per HPI   Physical Exam Triage Vital Signs ED Triage Vitals  Encounter Vitals Group     BP 07/28/23 0959 116/80     Systolic BP Percentile --      Diastolic BP Percentile --  Pulse Rate 07/28/23 0959 66     Resp 07/28/23 0959 16     Temp 07/28/23 0959 98.3 F (36.8 C)     Temp Source 07/28/23 0959 Oral     SpO2 07/28/23 0959 98 %     Weight --      Height --      Head Circumference --      Peak Flow --      Pain Score 07/28/23 1001 7     Pain Loc --      Pain Education --      Exclude from Growth Chart --    No data found.  Updated Vital Signs BP 116/80 (BP Location: Left Arm)   Pulse 66   Temp 98.3 F (36.8 C) (Oral)   Resp 16   LMP 07/25/2023 (Exact Date)   SpO2 98%   Visual Acuity Right Eye Distance:   Left Eye Distance:   Bilateral Distance:    Right Eye Near:   Left Eye Near:    Bilateral Near:     Physical Exam Vitals and nursing note reviewed.  Constitutional:      Appearance: Normal appearance.  HENT:     Head: Normocephalic and atraumatic.     Right Ear: External ear normal.     Left Ear: External ear normal.     Nose: Nose normal.     Mouth/Throat:     Mouth: Mucous membranes are moist.     Dentition: Dental tenderness and dental caries present.       Comments: Left upper molar is broken and tender. No obvious abscess or drainage. Left cheek area is TTP, warm and swollen.  Eyes:     Conjunctiva/sclera: Conjunctivae normal.  Cardiovascular:     Rate and Rhythm: Normal rate.  Pulmonary:     Effort: Pulmonary effort is normal. No respiratory distress.  Neurological:     General: No focal deficit present.     Mental Status: She is alert.  Psychiatric:        Mood and Affect: Mood normal.        Behavior: Behavior is cooperative.      UC Treatments / Results  Labs (all labs ordered are listed, but only abnormal results are displayed) Labs Reviewed - No data to display  EKG   Radiology No results found.  Procedures Procedures (including critical care time)  Medications Ordered in UC Medications - No data to display  Initial Impression / Assessment and Plan / UC Course  I have reviewed the triage vital signs and the nursing notes.  Pertinent labs & imaging results that were available during my care of the patient were reviewed by me and considered in my medical decision making (see chart for details).  Vitals and triage reviewed, patient is hemodynamically stable.  Significant left upper molar dental caries, erosion and tenderness to palpation.  Left cheek with erythema and swelling, suspect dental abscess.  Covered with Augmentin.  Pain management discussed.  Provided dental resources for follow-up.  Plan of care, follow-up care return precautions given, no questions at this time.    Final Clinical Impressions(s) / UC Diagnoses   Final diagnoses:  Dental infection  Dental caries     Discharge Instructions      Take antibiotics as prescribed and until finished, take with food to help prevent stomach upset.  For pain alternate between 800 mg of ibuprofen and 500 mg of Tylenol every 4-6 hours.  You can continue to  use ice to help with swelling.  Please follow-up with a dentist, as this tooth may need to be removed.   Return to clinic for any new or urgent symptoms.  [facility] dental [facility] [place] Address: [address] [place], [place], [place] [address] Phone: [phone]  [facility] [phone] extension [phone] [address]  Dr. Lawrence Marseilles [phone] [address].  [place] [phone] [address] [facility] [place].  [facility] [phone] extension [phone], [place], [place], [address] First come first serve for the first 10 clients.  May do simple extractions only, no wisdom teeth or surgery.  You may try the second for Thursday of the month starting at 6:30 AM.  [facility] You may call the school to see if they are still helping to provide dental care for emergent cases.     ED Prescriptions     Medication Sig Dispense Auth. Provider   amoxicillin-clavulanate (AUGMENTIN) 875-125 MG tablet Take 1 tablet by mouth every 12 (twelve) hours. 14 tablet Zamira Hickam, [place], [place]      PDMP not reviewed this encounter.   Jakye Mullens, [place] N, [place] 07/28/23 1016

## 2023-07-28 NOTE — Discharge Instructions (Addendum)
Take antibiotics as prescribed and until finished, take with food to help prevent stomach upset.  For pain alternate between 800 mg of ibuprofen and 500 mg of Tylenol every 4-6 hours.  You can continue to use ice to help with swelling.  Please follow-up with a dentist, as this tooth may need to be removed.  Return to clinic for any new or urgent symptoms.  [facility] dental service in [place] Address: [address] [place], [place], [place] [address] Phone: [phone]  [facility] [phone] extension [phone] [address]  Dr. Lawrence Marseilles [phone] [address]  [place] [phone] [address] [facility] [place].  [facility] [phone] extension [phone] [place], [place], [address] First come first serve for the first 10 clients.  May do simple extractions only, no wisdom teeth or surgery.  You may try the second for Thursday of the month starting at 6:30 AM.  [facility] You may call the school to see if they are still helping to provide dental care for emergent cases.

## 2023-07-28 NOTE — ED Triage Notes (Addendum)
Pt states she broke a tooth on the left upper 2 days ago.  Having pain and swelling to left side of her face. Has been using ice,tylenol and ibuprofen at home with some relief.

## 2023-08-19 ENCOUNTER — Encounter (HOSPITAL_BASED_OUTPATIENT_CLINIC_OR_DEPARTMENT_OTHER): Payer: Self-pay | Admitting: Family Medicine

## 2023-08-24 ENCOUNTER — Ambulatory Visit (HOSPITAL_BASED_OUTPATIENT_CLINIC_OR_DEPARTMENT_OTHER): Payer: Medicaid Other | Admitting: Family Medicine

## 2023-09-02 ENCOUNTER — Ambulatory Visit (HOSPITAL_BASED_OUTPATIENT_CLINIC_OR_DEPARTMENT_OTHER): Payer: Medicaid Other | Admitting: Family Medicine

## 2023-09-10 ENCOUNTER — Ambulatory Visit (INDEPENDENT_AMBULATORY_CARE_PROVIDER_SITE_OTHER): Payer: Medicaid Other | Admitting: Family Medicine

## 2023-09-10 ENCOUNTER — Encounter (HOSPITAL_BASED_OUTPATIENT_CLINIC_OR_DEPARTMENT_OTHER): Payer: Self-pay | Admitting: Family Medicine

## 2023-09-10 VITALS — BP 109/85 | HR 75 | Ht 65.0 in | Wt 202.8 lb

## 2023-09-10 DIAGNOSIS — D270 Benign neoplasm of right ovary: Secondary | ICD-10-CM

## 2023-09-10 DIAGNOSIS — E782 Mixed hyperlipidemia: Secondary | ICD-10-CM | POA: Diagnosis not present

## 2023-09-10 DIAGNOSIS — Z0000 Encounter for general adult medical examination without abnormal findings: Secondary | ICD-10-CM

## 2023-09-10 DIAGNOSIS — Z1231 Encounter for screening mammogram for malignant neoplasm of breast: Secondary | ICD-10-CM

## 2023-09-10 NOTE — Progress Notes (Signed)
Complete physical exam  Patient: Shelley Higgins   DOB: 08/17/1981   42 y.o. Female  MRN: [id number]  Subjective:    Shelley Higgins is a 42 y.o. female who presents today for a complete physical exam. She reports consuming a general diet. The patient does not participate in regular exercise at present. She generally feels well. She reports sleeping well. She does not have additional problems to discuss today.   The 10-year ASCVD risk score (Arnett DK, et al., 2019) is: 0.2%   Values used to calculate the score:     Age: 49 years     Sex: Female     Is Non-Hispanic African American: Yes     Diabetic: No     Tobacco smoker: No     Systolic Blood Pressure: 109 mmHg     Is BP treated: No     HDL Cholesterol: 66 mg/dL     Total Cholesterol: 230 mg/dL  HEALTH SCREENINGS: - Vision Screening: plans to schedule - Dental Visits: plans to schedule  - Pap smear: up to date - Breast Exam: discussed self breast exams  - Mammogram (40+): Ordered today  - Colonoscopy (45+): Not applicable  - Bone Density (65+ or under 65 with predisposing conditions): Not applicable  - Lung CA screening with low-dose CT:  Not applicable Adults age 22-80 who are current cigarette smokers or quit within the last 15 years. Must have 20 pack year history.   IMMUNIZATIONS: - Tdap: Tetanus vaccination status reviewed: last tetanus booster within 10 years. - HPV: Declines, will consider  - Influenza: Refused - Pneumovax: Not applicable - Prevnar 20: Not applicable - Zostavax (50+): Not applicable   Depression screenings:    07/13/2023   10:29 AM 03/20/2021    2:40 PM 12/02/2020   10:37 AM  Depression screen PHQ 2/9  Decreased Interest 0 0 0  Down, Depressed, Hopeless 0 0 0  PHQ - 2 Score 0 0 0  Altered sleeping 3 1   Tired, decreased energy 0 0   Change in appetite 2 1   Feeling bad or failure about yourself  0 0   Trouble concentrating 0 1   Moving slowly or fidgety/restless 1 0   Suicidal thoughts 0  0   PHQ-9 Score 6 3   Difficult doing work/chores Somewhat difficult      Anxiety screenings:    07/13/2023   10:29 AM 03/20/2021    2:41 PM  GAD 7 : Generalized Anxiety Score  Nervous, Anxious, on Edge 3 1  Control/stop worrying 2 1  Worry too much - different things 2 1  Trouble relaxing 2 1  Restless 1 1  Easily annoyed or irritable 1 2  Afraid - awful might happen 2 2  Total GAD 7 Score 13 9  Anxiety Difficulty Somewhat difficult Somewhat difficult    Patient Care Team: Alyson Reedy, FNP as PCP - General (Family Medicine)   Outpatient Medications Prior to Visit  Medication Sig   hydrOXYzine (ATARAX) 25 MG tablet Take 25 mg by mouth 3 (three) times daily as needed for anxiety.   pantoprazole (PROTONIX) 40 MG tablet Take 1 tablet (40 mg total) by mouth daily.   [DISCONTINUED] amoxicillin-clavulanate (AUGMENTIN) 875-125 MG tablet Take 1 tablet by mouth every 12 (twelve) hours. (Patient not taking: Reported on 09/10/2023)   No facility-administered medications prior to visit.    Review of Systems  Constitutional: Negative.   HENT: Negative.    Eyes: Negative.   Respiratory:  Negative.    Cardiovascular: Negative.   Gastrointestinal: Negative.   Genitourinary: Negative.   Musculoskeletal: Negative.   Skin: Negative.   Neurological: Negative.   Endo/Heme/Allergies: Negative.   Psychiatric/Behavioral: Negative.       Objective:    BP 109/85   Pulse 75   Ht 5\' 5"  (1.651 m)   Wt 202 lb 12.8 oz (92 kg)   LMP 08/18/2023 (Exact Date)   SpO2 100%   BMI 33.75 kg/m  BP Readings from Last 3 Encounters:  09/10/23 109/85  07/28/23 116/80  07/13/23 115/70     Physical Exam Vitals reviewed.  Constitutional:      Appearance: Normal appearance.  HENT:     Head: Normocephalic.     Right Ear: Tympanic membrane, ear canal and external ear normal.     Left Ear: Tympanic membrane, ear canal and external ear normal.     Nose: Nose normal.     Mouth/Throat:      Mouth: Mucous membranes are moist.     Dentition: Abnormal dentition. Dental caries present.     Pharynx: Oropharynx is clear.      Comments: Upper L second molar cracked  Eyes:     Extraocular Movements: Extraocular movements intact.     Pupils: Pupils are equal, round, and reactive to light.  Cardiovascular:     Rate and Rhythm: Normal rate and regular rhythm.     Pulses: Normal pulses.     Heart sounds: Normal heart sounds.  Pulmonary:     Effort: Pulmonary effort is normal.     Breath sounds: Normal breath sounds.  Abdominal:     General: Abdomen is flat. Bowel sounds are normal.     Palpations: Abdomen is soft.  Musculoskeletal:        General: Normal range of motion.     Cervical back: Normal range of motion.  Skin:    General: Skin is warm and dry.  Neurological:     Mental Status: She is alert.  Psychiatric:        Mood and Affect: Mood normal.        Behavior: Behavior normal.        Thought Content: Thought content normal.        Judgment: Judgment normal.         Assessment & Plan:    Routine Health Maintenance and Physical Exam  Health Maintenance  Topic Date Due   Mammogram  Never done   COVID-19 Vaccine (1 - 2024-25 season) 09/26/2023*   Flu Shot  12/27/2023*   Pap with HPV screening  10/07/2025   DTaP/Tdap/Td vaccine (2 - Td or Tdap) 09/09/2030   Hepatitis C Screening  Completed   HIV Screening  Completed   HPV Vaccine  Aged Out  *Topic was postponed. The date shown is not the original due date.    1. Wellness examination (Primary) Labs reviewed/discussed today.  Review of PMH, FH, SH, medications and HM performed. Preventative care hand-out provided.  Provided patient with local dentists.  Recommend healthy diet.  Recommend approximately 150 minutes/week of moderate intensity exercise. Recommend regular dental and vision exams. Always use seatbelt/lap and shoulder restraints. Recommend using smoke alarms and checking batteries at least twice a  year. Recommend using sunscreen when outside. Discussed immunization recommendations for influenza vaccine. Patient declines at this time, will consider. Vaccines are UTD.   2. Mixed hyperlipidemia Recent lipid panel reviewed with patient. Total cholesterol 230 and LDL 146. She was on statin in  the past and did not tolerate it due to muscle weakness. Discussed lifestyle modifications- including healthy diet and daily exercise.   3. Screening mammogram for breast cancer Breast cancer screening recommendations reviewed. Patient is at the age (13-44) where she could start getting annual mammogram screenings. Order placed.  - MM 3D SCREENING MAMMOGRAM BILATERAL BREAST; Future  4. Dermoid cyst of right ovary Patient was seen in 2016 with OBGYN for lower abdominal pain and was diagnosed with a right ovarian dermoid cyst. Plan of care was to repeat US in 3 months and patient reports she never did follow-up. Reports having urinary frequency and some lower abdominal discomfort. Order placed for TVUS.  - US Transvaginal Non-OB; Future  Return in about 1 year (around 09/09/2024) for Physical with fasting labs.    In addition to her physical, spent an additional 20 minutes on this patient encounter, including preparation, chart review, face-to-face counseling with patient and coordination of care, and documentation of encounter regarding acute concerns.    Alyson Reedy, FNP

## 2023-09-10 NOTE — Patient Instructions (Addendum)
Affordable Dental Services for Adults   [facility] [address] [place], [place] [address] [phone] Access provides dental services to  uninsured [facility] residents  who are enrolled in the [facility]. Services will  be limited to comprehensive  examinations, extractions, fillings, pain  management and some minor  restorative care. In order to qualify for  services, patients will be referred by  [facility]  [facility]) Safety Net Organizations that  already provide medical care to  residents with incomes between 0% and  200% of the federal poverty level.   Payment Options $40.00 per visit  (CASH ONLY)  [facility] Our Dental Clinic is open Monday through Thursday from 7:30 am - 4:00 pm. The dental clinic is available for adults and children with:  [place] Medicaid, [place] Health Choice, [facility], and Self-Pay. Uninsured patients can be seen on a sliding fee scale based on household income.   [facility] [address] [place], [place] [address] Phone [phone] Fax [phone]  Our clinic is open Monday through Friday 8:00 a.m. until 5:00 p.m, with the exception of Wednesday 10 a.m. to 5 p.m.  Medicaid and other insurance plans are welcome. Payment for services is due when services are rendered and may be made by cash or credit card.  If you have dental insurance, we will assist you with your claim submission   Dental list         Updated 8.18.22 These dentists all accept Medicaid.  The list is a courtesy and for your convenience.  [facility]     [phone] [address]  [address] [place] [address] Se habla espaol From 41 to 27 years old Parent may go with child only for cleaning Vinson Moselle DDS     [phone] Milus Banister,  DDS (Spanish speaking) [address] [place]  [address] Se habla espaol New patients 8 and under, established until 18y.o Parent may go with child if needed  Marolyn Hammock DMD    [phone] [address] [place] [address] Se habla espaol [place] spoken From 41 years old Parent may go with child [facility]     [phone] [address] [place] [address] Se habla espaol, translation line, prefer for translator to be present  From 41 to 66 years old Ages 1-3y parents may go back 4+ go back by themselves parents can watch at "[place] area"  [place] [facility]  [phone] [facility]      [address]  [place] [address] Se habla espaol [place] spoken (preferred to bring translator) From teeth coming in to 95 years old Parent may go with child  Spanish [facility]     [phone] [address] [place]. [place] [address] Requires certification. Call for information. Requiere certificacin. Llame para informacin. Algunos dias se habla espaol  From birth to 20 years Parent possibly goes with child   Bradd Canary DDS     [phone] [address] [place]  [address] [place] [address] Se habla espaol From 41 to 18 years  Parent may NOT go with child  [facility] D[facility]     Garlon Hatchet DDS  [phone] [address] [place] [address] Se habla espaol- phone interpreters Ages 10 years and older Parent may go with child-  15+ go back alone   Melynda Ripple DDS    [phone] [address] [place] [address] Se habla espaol , 3 of their providers speak [place] From 41 months to 29 years old Parent may go with child [facility]  [phone] [address] [place] [address] Se habla espanol Interpretation for other languages Special needs children welcome Ages 41 and under  [facility]    [phone] [address] [place]  [place] [address] No se habla espaol From birth [facility]   [phone] Dr. Orlean Patten [address] [place], [place] [address] From birth to 41 y- new patients 10 and under Special needs children welcome   [facility] [place] [phone] Se habla espaol [address] [place], [place] [address]  41 month to 19 years  [facility] Janyth Pupa [phone] [address] [place], [place] [address]  Se habla espaol 6 months and up, highest age is 41-17 for new patients, will see established patients until 32 y.o Parents may go back with child

## 2023-09-16 ENCOUNTER — Other Ambulatory Visit: Payer: Medicaid Other

## 2023-09-24 ENCOUNTER — Ambulatory Visit
Admission: RE | Admit: 2023-09-24 | Discharge: 2023-09-24 | Disposition: A | Discharge status: Home / Self Care | Payer: Medicaid Other | Source: Ambulatory Visit | Attending: Family Medicine

## 2023-09-24 DIAGNOSIS — D270 Benign neoplasm of right ovary: Secondary | ICD-10-CM

## 2023-09-26 ENCOUNTER — Other Ambulatory Visit (HOSPITAL_BASED_OUTPATIENT_CLINIC_OR_DEPARTMENT_OTHER): Payer: Self-pay | Admitting: Family Medicine

## 2023-09-26 DIAGNOSIS — R9389 Abnormal findings on diagnostic imaging of other specified body structures: Secondary | ICD-10-CM

## 2023-09-26 DIAGNOSIS — N838 Other noninflammatory disorders of ovary, fallopian tube and broad ligament: Secondary | ICD-10-CM

## 2023-10-05 ENCOUNTER — Encounter (HOSPITAL_BASED_OUTPATIENT_CLINIC_OR_DEPARTMENT_OTHER): Payer: Self-pay | Admitting: Family Medicine

## 2023-10-06 ENCOUNTER — Ambulatory Visit: Payer: Medicaid Other

## 2023-10-07 MED ORDER — ALPRAZOLAM 0.5 MG PO TABS: 0.5000 mg | ORAL_TABLET | Freq: Once | ORAL | 0 refills | Status: DC | PRN

## 2023-10-08 ENCOUNTER — Other Ambulatory Visit (HOSPITAL_BASED_OUTPATIENT_CLINIC_OR_DEPARTMENT_OTHER): Payer: Self-pay

## 2023-10-08 DIAGNOSIS — R1013 Epigastric pain: Secondary | ICD-10-CM

## 2023-10-08 MED ORDER — PANTOPRAZOLE SODIUM 40 MG PO TBEC: 40.0000 mg | DELAYED_RELEASE_TABLET | Freq: Every day | ORAL | 0 refills | Status: AC

## 2023-10-11 ENCOUNTER — Ambulatory Visit
Admission: RE | Admit: 2023-10-11 | Discharge: 2023-10-11 | Disposition: A | Discharge status: Home / Self Care | Payer: Medicaid Other | Source: Ambulatory Visit | Attending: Family Medicine | Admitting: Family Medicine

## 2023-10-11 DIAGNOSIS — Z1231 Encounter for screening mammogram for malignant neoplasm of breast: Secondary | ICD-10-CM

## 2023-10-22 ENCOUNTER — Ambulatory Visit
Admission: RE | Admit: 2023-10-22 | Discharge: 2023-10-22 | Disposition: A | Discharge status: Home / Self Care | Payer: Medicaid Other | Source: Ambulatory Visit | Attending: Family Medicine

## 2023-10-22 DIAGNOSIS — N838 Other noninflammatory disorders of ovary, fallopian tube and broad ligament: Secondary | ICD-10-CM

## 2023-10-22 DIAGNOSIS — R9389 Abnormal findings on diagnostic imaging of other specified body structures: Secondary | ICD-10-CM

## 2023-10-22 MED ORDER — GADOPICLENOL 0.5 MMOL/ML IV SOLN
9.0000 mL | Freq: Once | INTRAVENOUS | Status: AC | PRN
  Administered 2023-10-22: 9 mL via INTRAVENOUS

## 2023-10-26 ENCOUNTER — Other Ambulatory Visit: Payer: Self-pay | Admitting: Family Medicine

## 2023-10-26 ENCOUNTER — Encounter: Payer: Self-pay | Admitting: Family Medicine

## 2023-10-26 DIAGNOSIS — D259 Leiomyoma of uterus, unspecified: Secondary | ICD-10-CM

## 2023-10-26 DIAGNOSIS — N83201 Unspecified ovarian cyst, right side: Secondary | ICD-10-CM

## 2023-11-04 ENCOUNTER — Other Ambulatory Visit (HOSPITAL_BASED_OUTPATIENT_CLINIC_OR_DEPARTMENT_OTHER): Payer: Self-pay | Admitting: Certified Nurse Midwife

## 2023-11-14 ENCOUNTER — Ambulatory Visit (HOSPITAL_COMMUNITY)
Admission: EM | Admit: 2023-11-14 | Discharge: 2023-11-14 | Disposition: A | Discharge status: Home / Self Care | Payer: Medicaid Other | Attending: Emergency Medicine | Admitting: Emergency Medicine

## 2023-11-14 ENCOUNTER — Encounter (HOSPITAL_COMMUNITY): Payer: Self-pay

## 2023-11-14 DIAGNOSIS — R112 Nausea with vomiting, unspecified: Secondary | ICD-10-CM

## 2023-11-14 MED ORDER — ONDANSETRON 4 MG PO TBDP
ORAL_TABLET | ORAL | Status: AC
  Filled 2023-11-14: qty 1

## 2023-11-14 MED ORDER — ONDANSETRON 4 MG PO TBDP: 4.0000 mg | ORAL_TABLET | Freq: Three times a day (TID) | ORAL | 0 refills | Status: AC | PRN

## 2023-11-14 MED ORDER — ONDANSETRON 4 MG PO TBDP
4.0000 mg | ORAL_TABLET | Freq: Once | ORAL | Status: AC
  Administered 2023-11-14: 4 mg via ORAL

## 2023-11-14 NOTE — Discharge Instructions (Addendum)
You can use the nausea medicine every 8 hours as needed.  Please do sips of water, ginger ale and broth today.  Starting tomorrow you can progress to solids such as bananas, rice, toast and applesauce.  Avoid fried or spicy foods as this may further irritate the gastrointestinal tract.  Avoid alcohol as well.  Seek immediate care within wrist emergency department if you are unable to hold down food or fluids despite these interventions.  Return to clinic for new or urgent symptoms.

## 2023-11-14 NOTE — ED Triage Notes (Signed)
Patient here today with c/o nausea and vomiting that started this morning upon waking. Patient states that she went out drinking alcohol last night. Patient is unable to keep anything down.

## 2023-11-14 NOTE — ED Provider Notes (Signed)
[facility]    CSN: [id number] Arrival date & time: 11/14/23  1521      History   Chief Complaint Chief Complaint  Patient presents with   Emesis    HPI Shelley Higgins is a 43 y.o. female.   Patient presents to clinic over concerns of nausea and vomiting that started this morning when she woke up.  She woke up around 07/27/2010, tried to have a sip of Gatorade and started vomiting.  Vomit was initially the Gatorade and then turned to a yellow color.  Reports has not been able to hold down any fluids since this morning.  Endorses excess alcohol use last night, is unable to quantify amount, endorses shots and liquor intake.  No diarrhea.  Endorses vomiting 3 times since she has been here.  The history is provided by the patient and medical records.  Emesis   Past Medical History:  Diagnosis Date   Anxiety    Phreesia 09/08/2020   Dermoid cyst    GERD (gastroesophageal reflux disease)    Phreesia 09/08/2020   Hyperlipidemia    Pneumonia    TB (tuberculosis)    Tuberculosis    Phreesia 09/08/2020    Patient Active Problem List   Diagnosis Date Noted   Epigastric abdominal pain 07/14/2023   Urinary frequency 07/14/2023   Wellness examination 07/14/2023   Anxiety 07/14/2023   Vitamin D deficiency 09/10/2020   Hyperlipidemia 09/10/2020   GERD (gastroesophageal reflux disease) 09/09/2020   Ovarian cyst 08/13/2014    Past Surgical History:  Procedure Laterality Date   INDUCED ABORTION     WISDOM TOOTH EXTRACTION      OB History     Gravida  3   Para  2   Term  2   Preterm      AB  1   Living  2      SAB      IAB  1   Ectopic      Multiple      Live Births               Home Medications    Prior to Admission medications   Medication Sig Start Date End Date Taking? Authorizing Provider  ondansetron (ZOFRAN-ODT) 4 MG disintegrating tablet Take 1 tablet (4 mg total) by mouth every 8 (eight) hours as needed for nausea or  vomiting. 11/14/23  Yes Rinaldo Ratel, [place] N, FNP  hydrOXYzine (ATARAX) 25 MG tablet Take 25 mg by mouth 3 (three) times daily as needed for anxiety.    [provider]  pantoprazole (PROTONIX) 40 MG tablet Take 1 tablet (40 mg total) by mouth daily. 10/08/23   Alyson Reedy, FNP    Family History Family History  Problem Relation Age of Onset   Stroke Mother    Hypertension Mother    Diabetes Mother    Miscarriages / [place] Son     Social History Social History   Tobacco Use   Smoking status: Former    Current packs/day: 0.00    Average packs/day: 1 pack/day for 20.0 years (20.0 ttl pk-yrs)    Types: Cigarettes    Start date: 06/04/2000    Quit date: 06/04/2020    Years since quitting: 3.4   Smokeless tobacco: Never  Vaping Use   Vaping status: Never Used  Substance Use Topics   Alcohol use: Yes    Alcohol/week: 6.0 standard drinks of alcohol    Types: 3 Glasses of wine, 3 Shots of liquor  per week    Comment: socially   Drug use: Yes    Types: Marijuana     Allergies   Patient has no known allergies.   Review of Systems Review of Systems  Per HPI   Physical Exam Triage Vital Signs ED Triage Vitals  Encounter Vitals Group     BP 11/14/23 1631 113/78     Systolic BP Percentile --      Diastolic BP Percentile --      Pulse Rate 11/14/23 1631 79     Resp 11/14/23 1631 16     Temp 11/14/23 1631 98.6 F (37 C)     Temp Source 11/14/23 1631 Oral     SpO2 11/14/23 1631 95 %     Weight 11/14/23 1630 200 lb (90.7 kg)     Height 11/14/23 1630 5\' 5"  (1.651 m)     Head Circumference --      Peak Flow --      Pain Score 11/14/23 1629 7     Pain Loc --      Pain Education --      Exclude from Growth Chart --    No data found.  Updated Vital Signs BP 113/78 (BP Location: Left Arm)   Pulse 79   Temp 98.6 F (37 C) (Oral)   Resp 16   Ht 5\' 5"  (1.651 m)   Wt 200 lb (90.7 kg)   LMP 11/06/2023 (Approximate)   SpO2 95%   BMI 33.28 kg/m   Visual  Acuity Right Eye Distance:   Left Eye Distance:   Bilateral Distance:    Right Eye Near:   Left Eye Near:    Bilateral Near:     Physical Exam Vitals and nursing note reviewed.  Constitutional:      Appearance: Normal appearance.  HENT:     Head: Normocephalic and atraumatic.     Right Ear: External ear normal.     Left Ear: External ear normal.     Nose: Nose normal.     Mouth/Throat:     Mouth: Mucous membranes are moist.  Eyes:     Conjunctiva/sclera: Conjunctivae normal.  Cardiovascular:     Rate and Rhythm: Normal rate and regular rhythm.     Heart sounds: Normal heart sounds. No murmur heard. Pulmonary:     Effort: Pulmonary effort is normal. No respiratory distress.     Breath sounds: Normal breath sounds.  Abdominal:     General: Abdomen is flat. Bowel sounds are normal. There is no distension.     Palpations: Abdomen is soft.     Tenderness: There is no abdominal tenderness. There is no guarding.  Skin:    General: Skin is warm and dry.  Neurological:     General: No focal deficit present.     Mental Status: She is alert and oriented to person, place, and time.  Psychiatric:        Mood and Affect: Mood normal.        Behavior: Behavior normal.      UC Treatments / Results  Labs (all labs ordered are listed, but only abnormal results are displayed) Labs Reviewed - No data to display  EKG   Radiology No results found.  Procedures Procedures (including critical care time)  Medications Ordered in UC Medications  ondansetron (ZOFRAN-ODT) disintegrating tablet 4 mg (4 mg Oral Given 11/14/23 1651)    Initial Impression / Assessment and Plan / UC Course  I have reviewed the  triage vital signs and the nursing notes.  Pertinent labs & imaging results that were available during my care of the patient were reviewed by me and considered in my medical decision making (see chart for details).  Vitals and triage reviewed, patient is hemodynamically stable.   Abdomen is soft and nontender with active bowel sounds.  ODT Zofran given in clinic, tolerating sips of ginger ale afterwards.  Will discharge home on liquid diet with gradual progression as tolerated.  Encourage cessation of alcohol use.  Plan of care, follow-up care return precautions given, no questions at this time.     Final Clinical Impressions(s) / UC Diagnoses   Final diagnoses:  Nausea and vomiting, unspecified vomiting type     Discharge Instructions      You can use the nausea medicine every 8 hours as needed.  Please do sips of water, ginger ale and broth today.  Starting tomorrow you can progress to solids such as bananas, rice, toast and applesauce.  Avoid fried or spicy foods as this may further irritate the gastrointestinal tract.  Avoid alcohol as well.  Seek immediate care within wrist emergency department if you are unable to hold down food or fluids despite these interventions.  Return to clinic for new or urgent symptoms.     ED Prescriptions     Medication Sig Dispense Auth. Provider   ondansetron (ZOFRAN-ODT) 4 MG disintegrating tablet Take 1 tablet (4 mg total) by mouth every 8 (eight) hours as needed for nausea or vomiting. 20 tablet Joseeduardo Brix, [place] N, [place]      PDMP not reviewed this encounter.   Byrd Terrero, [place] N, [place] 11/14/23 [phone]

## 2023-12-23 ENCOUNTER — Ambulatory Visit (INDEPENDENT_AMBULATORY_CARE_PROVIDER_SITE_OTHER): Payer: Medicaid Other | Admitting: Obstetrics and Gynecology

## 2023-12-23 ENCOUNTER — Encounter: Payer: Self-pay | Admitting: Obstetrics and Gynecology

## 2023-12-23 VITALS — BP 114/80 | HR 88 | Wt 203.0 lb

## 2023-12-23 DIAGNOSIS — D369 Benign neoplasm, unspecified site: Secondary | ICD-10-CM

## 2023-12-23 NOTE — Progress Notes (Signed)
 Acute Office Visit  Subjective:    Patient ID: Shelley Higgins, female    DOB: 1981-02-23, 43 y.o.   MRN: [id number]   HPI 43 y.o. presents today for NGYN (NGYN//jj/10-22-23 MRI pelvis dermoid cyst, fibroid, bartholin's gland cyst on report/Pt c/o vaginal bump) .  Patient's last menstrual period was 11/30/2023. Period Duration (Days): 5-6 Period Pattern: Regular Menstrual Flow: Heavy Menstrual Control: Maxi pad, Tampon Dysmenorrhea: (!) Mild Dysmenorrhea Symptoms: Cramping, Headache   Patient reports her periods are very heavy and last 5-6 days each month and they are painful. She is changing her pad every couple of hours and has clots. She has a 4 and 42 y/o and does not desire children Her partner of 12 years does not have a child and would like one.  Her MRI on 10/22/23 showed Reproductive:   -- Uterus: Measures 11.0 x 5.1 by 6.4 cm (volume = 190 cm^3). Two small left posterior uterine fibroids are seen measuring 2.2 cm and 1.4 cm. Cervix is unremarkable. 1 cm Bartholin's gland cyst seen at the vaginal orifice.   -- Right ovary: A bilobed fat signal intensity mass is seen in the right ovary which measures 5.2 x 3.7 cm, consistent with a dermoid.   -- Left ovary: Appears normal. No ovarian or adnexal masses identified.   Other: No peritoneal thickening or abnormal free fluid.   Musculoskeletal:  Unremarkable.   IMPRESSION: 5.2 cm right ovarian dermoid.   Normal appearance of left ovary.   Two small uterine fibroids, largest measuring 2.2 cm.   1 cm Bartholin's gland cyst.     Electronically Signed   By: Danae Orleans M.D.   On: 10/23/2023 18:03  Patient also feels a bump on her labia and is worried about a STI. Reports it is non tender and has been present for over a couple months Review of Systems     Objective:    OBGyn Exam  BP 114/80   Pulse 88   Wt 203 lb (92.1 kg)   LMP 11/30/2023   SpO2 99%   BMI 33.78 kg/m  Wt Readings from Last 3  Encounters:  12/23/23 203 lb (92.1 kg)  11/14/23 200 lb (90.7 kg)  09/10/23 202 lb 12.8 oz (92 kg)   Past Medical History:  Diagnosis Date   Abnormal Pap smear of cervix    Anxiety    [employer] 09/08/2020   Dermoid cyst    Fibroid    GERD (gastroesophageal reflux disease)    [employer] 09/08/2020   Hyperlipidemia    Menstrual headache    Pneumonia    Prediabetes    STD (sexually transmitted disease)    gonorrhea & chlamydia treated   TB (tuberculosis)    Tuberculosis    [employer] 09/08/2020   Past Surgical History:  Procedure Laterality Date   COLPOSCOPY     INDUCED ABORTION     WISDOM TOOTH EXTRACTION     Social History   Socioeconomic History   Marital status: Single    Spouse name: Not on file   Number of children: Not on file   Years of education: Not on file   Highest education level: Not on file  Occupational History   Not on file  Tobacco Use   Smoking status: Former    Current packs/day: 0.00    Average packs/day: 1 pack/day for 20.0 years (20.0 ttl pk-yrs)    Types: Cigarettes    Start date: 06/04/2000    Quit date: 06/04/2020  Years since quitting: 3.5   Smokeless tobacco: Never  Vaping Use   Vaping status: Never Used  Substance and Sexual Activity   Alcohol use: Not Currently    Comment: socially   Drug use: Yes    Types: Marijuana   Sexual activity: Yes    Partners: Male    Birth control/protection: None  Other Topics Concern   Not on file  Social History Narrative   Not on file   Social Drivers of Health   Financial Resource Strain: Not on file  Food Insecurity: Not on file  Transportation Needs: Not on file  Physical Activity: Not on file  Stress: Not on file  Social Connections: Not on file   Current Outpatient Medications on File Prior to Visit  Medication Sig Dispense Refill   hydrOXYzine (ATARAX) 25 MG tablet Take 25 mg by mouth 3 (three) times daily as needed for anxiety.     ondansetron (ZOFRAN-ODT) 4 MG disintegrating tablet  Take 1 tablet (4 mg total) by mouth every 8 (eight) hours as needed for nausea or vomiting. 20 tablet 0   pantoprazole (PROTONIX) 40 MG tablet Take 1 tablet (40 mg total) by mouth daily. (Patient taking differently: Take 40 mg by mouth as needed.) 90 tablet 0   No current facility-administered medications on file prior to visit.      Sve: left labia with sebaceous cyst <66mm  Patient informed chaperone available to be present for breast and/or pelvic exam. Patient has requested no chaperone to be present. Patient has been advised what will be completed during breast and pelvic exam.   Assessment & Plan:  Fibroids, right ovarian dermoid, menorrhagia, dysmenorrhea  Counseled on dermoids 10% are malignant and rest are benign.  However, A pathological sample is the only way to determine 100% it is benign.  Discussed also the risk of torsion and the s/s of this. Reviewed all options with cystectomy with possible myomectomy, RLH with cystectomy and bilateral salpingectomy.  Discussed r/b/a/I of each in detail. She will consider her options.  Discussed risk of miscarriage, chromosomal abnormalities, risk for PreE with being over the age of 43 with a pregnancy. Discussed risk of recurrence of 50% in 5 years with myomectomy.  She will consider her options and let us know.  Patient currently without pain on the right and was encouraged to return with any right lower pelvic pain. 4.  Patient reassured with sebaceous cyst and encouraged warm compress with soap and then compression. 30 minutes spent on reviewing records, imaging,  and one on one patient time and counseling patient and documentation Dr. Judith Blonder

## 2024-08-31 ENCOUNTER — Encounter (HOSPITAL_BASED_OUTPATIENT_CLINIC_OR_DEPARTMENT_OTHER): Admitting: Family Medicine

## 2024-09-13 ENCOUNTER — Encounter (HOSPITAL_BASED_OUTPATIENT_CLINIC_OR_DEPARTMENT_OTHER): Payer: Medicaid Other | Admitting: Family Medicine
